# Patient Record
Sex: Female | Born: 1975 | Race: White | Hispanic: No | Marital: Married | Smoking: Never smoker
Health system: Southern US, Academic
[De-identification: ages and names within clinical notes are randomized; demographics above are authoritative.]

## PROBLEM LIST (undated history)

## (undated) DIAGNOSIS — F329 Major depressive disorder, single episode, unspecified: Secondary | ICD-10-CM

## (undated) DIAGNOSIS — E039 Hypothyroidism, unspecified: Secondary | ICD-10-CM

## (undated) DIAGNOSIS — K3184 Gastroparesis: Secondary | ICD-10-CM

## (undated) DIAGNOSIS — F419 Anxiety disorder, unspecified: Secondary | ICD-10-CM

## (undated) DIAGNOSIS — D688 Other specified coagulation defects: Secondary | ICD-10-CM

## (undated) DIAGNOSIS — K219 Gastro-esophageal reflux disease without esophagitis: Secondary | ICD-10-CM

## (undated) DIAGNOSIS — I10 Essential (primary) hypertension: Secondary | ICD-10-CM

## (undated) DIAGNOSIS — F32A Depression, unspecified: Secondary | ICD-10-CM

## (undated) DIAGNOSIS — E119 Type 2 diabetes mellitus without complications: Secondary | ICD-10-CM

## (undated) DIAGNOSIS — M797 Fibromyalgia: Secondary | ICD-10-CM

## (undated) HISTORY — PX: ANKLE SURGERY: SHX546

## (undated) HISTORY — DX: Hypothyroidism, unspecified: E03.9

## (undated) HISTORY — DX: Essential (primary) hypertension: I10

## (undated) HISTORY — DX: Fibromyalgia: M79.7

## (undated) HISTORY — DX: Gastro-esophageal reflux disease without esophagitis: K21.9

## (undated) HISTORY — DX: Other specified coagulation defects: D68.8

## (undated) HISTORY — DX: Anxiety disorder, unspecified: F41.9

## (undated) HISTORY — DX: Gastroparesis: K31.84

## (undated) HISTORY — PX: HX GALL BLADDER SURGERY/CHOLE: SHX55

## (undated) HISTORY — PX: HX CERVICAL POLYPECTOMY: SHX88

## (undated) HISTORY — DX: Depression, unspecified: F32.A

## (undated) HISTORY — DX: Type 2 diabetes mellitus without complications: E11.9

## (undated) NOTE — Progress Notes (Signed)
 Formatting of this note is different from the original.  Subjective   Patient ID: Dawn Michael is a 6 y.o. female presenting to the Urgent Care with a chief complaint of Cough (Scratchy throat,ear pain ,congestion x 6 days ).    HPI  Presents to clinic with complaint of cough, congestion, sore throat x ~ 1 week. Some ear pain, headaches, diarrhea.   No known exposures.  Taking OTC meds, such as Advil cold and flu, with little relief  Denies rashes, dyspnea, vomiting.  Some harsh coughing spells leaving tightness in chest and out of breath, but temporarily.    Objective   Vitals:    05/28/24 0806   BP: (!) 148/87   BP Location: Left arm   Patient Position: Sitting   BP Cuff Size: Adult   Pulse: 87   Temp: 36.1 C (96.9 F)   TempSrc: Temporal   Weight: 113 kg (250 lb)   Height: 1.651 m (5' 5)   BMI (Calculated): 41.6 kg/m2   BSA (Calculated - sq m): 2.28 sq meters   Resp: 20   SpO2: 95%     OB Vitals  OB Status Implant     Social History     Tobacco Use   Smoking Status Never    Passive exposure: Never   Smokeless Tobacco Never       Vital signs reviewed.    Physical Exam  Vitals reviewed.   Constitutional:       General: She is not in acute distress.     Appearance: Normal appearance. She is normal weight. She is ill-appearing. She is not toxic-appearing.   HENT:      Head: Normocephalic and atraumatic.      Right Ear: Tympanic membrane, ear canal and external ear normal.      Left Ear: Tympanic membrane, ear canal and external ear normal.      Nose: Congestion and rhinorrhea present.      Mouth/Throat:      Mouth: Mucous membranes are moist.      Pharynx: Oropharynx is clear. Postnasal drip present. No posterior oropharyngeal erythema.   Eyes:      Conjunctiva/sclera: Conjunctivae normal.      Pupils: Pupils are equal, round, and reactive to light.   Cardiovascular:      Rate and Rhythm: Normal rate and regular rhythm.      Heart sounds: Normal heart sounds.   Pulmonary:      Effort: Pulmonary effort is  normal.      Breath sounds: Normal breath sounds. No wheezing, rhonchi or rales.   Musculoskeletal:      Cervical back: Neck supple.   Lymphadenopathy:      Cervical: No cervical adenopathy.   Skin:     General: Skin is warm.      Findings: No erythema, lesion or rash.   Neurological:      General: No focal deficit present.      Mental Status: She is alert.      Coordination: Coordination normal.      Gait: Gait normal.   Psychiatric:         Mood and Affect: Mood normal.         Behavior: Behavior normal.         Assessment & Plan  Hypertension, unspecified type        History of diabetes mellitus        Acute sinusitis, recurrence not specified, unspecified location  Patient Instructions  Return to clinic or follow up with primary care provider for re-evaluation if symptoms/condition worsening or not improving within the next 7-10 days.  If condition significantly worsening, or if new symptoms such as chest pain or shortness of breath develop, go directly to the nearest emergency room for evaluation.  May take over-the-counter pain/fever reducers as needed. Use as directed.  May take over-the-counter cough/cold medications as needed. Use as directed.  Be sure to drink plenty of fluids and rest during your illness.  Follow up with your primary care provider for blood pressure monitoring, evaluation, and treatment considerations.  Monitor your blood sugar closely. If your glucose rises significantly or if you experience new symptoms, follow up with your managing doctor or go directly to the emergency room for evaluation and management.'    Orders:    azithromycin  (Zithromax ) 250 MG tablet; Take once daily by mouth: 2 tablets on day 1, 1 tablet on days 2-5.    Respiratory infection    Orders:    benzonatate (Tessalon) 100 MG capsule; Take 1 capsule by mouth every 8 hours if needed for cough for up to 10 days. Do not crush or chew.    Bronchospasm    Orders:    albuterol HFA 90 mcg/act inhaler; Inhale 2 puffs every 4  hours if needed for wheezing or shortness of breath (cough).        In-House Lab Results:   No results found for this or any previous visit.     In-House Imaging Reads:        Procedure Documentation:  Procedures         ED Course & MDM   MDM - Medical Decision Making: Home with return precautions    Electronically signed by Donnice JINNY Hoit, PA-C at 05/28/2024  8:26 AM EST

---
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
## 1999-11-18 ENCOUNTER — Ambulatory Visit (HOSPITAL_COMMUNITY): Payer: Self-pay

## 2004-03-16 ENCOUNTER — Ambulatory Visit (INDEPENDENT_AMBULATORY_CARE_PROVIDER_SITE_OTHER): Payer: Self-pay | Admitting: Ophthalmology

## 2015-11-12 DIAGNOSIS — K3184 Gastroparesis: Secondary | ICD-10-CM | POA: Insufficient documentation

## 2015-11-12 DIAGNOSIS — K219 Gastro-esophageal reflux disease without esophagitis: Secondary | ICD-10-CM | POA: Insufficient documentation

## 2015-11-12 DIAGNOSIS — R140 Abdominal distension (gaseous): Secondary | ICD-10-CM | POA: Insufficient documentation

## 2015-11-12 DIAGNOSIS — E282 Polycystic ovarian syndrome: Secondary | ICD-10-CM | POA: Insufficient documentation

## 2015-11-12 DIAGNOSIS — K589 Irritable bowel syndrome without diarrhea: Secondary | ICD-10-CM | POA: Insufficient documentation

## 2015-11-12 DIAGNOSIS — E1143 Type 2 diabetes mellitus with diabetic autonomic (poly)neuropathy: Secondary | ICD-10-CM | POA: Insufficient documentation

## 2015-12-03 DIAGNOSIS — K9041 Non-celiac gluten sensitivity: Secondary | ICD-10-CM | POA: Insufficient documentation

## 2017-02-13 ENCOUNTER — Ambulatory Visit (INDEPENDENT_AMBULATORY_CARE_PROVIDER_SITE_OTHER): Payer: BC Managed Care – PPO | Admitting: Family Medicine

## 2017-02-13 ENCOUNTER — Encounter (INDEPENDENT_AMBULATORY_CARE_PROVIDER_SITE_OTHER): Payer: Self-pay | Admitting: Family Medicine

## 2017-02-13 VITALS — BP 110/80 | HR 102 | Temp 98.0°F | Resp 12 | Ht 65.0 in | Wt 248.0 lb

## 2017-02-13 DIAGNOSIS — F329 Major depressive disorder, single episode, unspecified: Secondary | ICD-10-CM

## 2017-02-13 DIAGNOSIS — E119 Type 2 diabetes mellitus without complications: Secondary | ICD-10-CM

## 2017-02-13 DIAGNOSIS — K2100 Gastro-esophageal reflux disease with esophagitis, without bleeding: Secondary | ICD-10-CM | POA: Insufficient documentation

## 2017-02-13 DIAGNOSIS — Z6841 Body Mass Index (BMI) 40.0 and over, adult: Secondary | ICD-10-CM

## 2017-02-13 DIAGNOSIS — I10 Essential (primary) hypertension: Principal | ICD-10-CM | POA: Insufficient documentation

## 2017-02-13 DIAGNOSIS — E039 Hypothyroidism, unspecified: Secondary | ICD-10-CM

## 2017-02-13 MED ORDER — LOSARTAN 50 MG-HYDROCHLOROTHIAZIDE 12.5 MG TABLET
1.0000 | ORAL_TABLET | Freq: Every day | ORAL | 1 refills | Status: DC
Start: 2017-02-13 — End: 2017-10-28

## 2017-02-13 MED ORDER — METFORMIN 1,000 MG TABLET
ORAL_TABLET | ORAL | 1 refills | Status: DC
Start: 2017-02-13 — End: 2018-06-03

## 2017-02-13 MED ORDER — SYNTHROID 50 MCG TABLET
50.0000 ug | ORAL_TABLET | Freq: Every morning | ORAL | 1 refills | Status: DC
Start: 2017-02-13 — End: 2019-01-20

## 2017-02-13 NOTE — Progress Notes (Signed)
[facility]  [address]  [place] [address]  [facility]  History and Physical     Name: Dawn Michael MRN:  [id number]   Date: 02/13/2017 Age: 41 y.o.       Chief Complaint: Hypertension (Cont on meds to control Losartan 50/12.5 ); Depression (Mainainted on Cymbalta anxiety ); Hypothyroidism (Maintained on Synthroid no recent meds ); and Diabetes Follow up (Cont on Metformin 1000 BID )    History of Present Illness     [name], Michael     has been seen in this clinic within the last three years.     Patient is a 41 y.o. female presenting with hypertension.   Hypertension   This is a chronic problem. The current episode started more than 1 year ago. The problem is unchanged. The problem is controlled. Pertinent negatives include no anxiety, blurred vision, chest pain, headaches, malaise/fatigue, neck pain, orthopnea, palpitations, peripheral edema, PND, shortness of breath or sweats. There are no associated agents to hypertension. There are no known risk factors for coronary artery disease. The current treatment provides no improvement. There are no compliance problems.  There is no history of angina, kidney disease, CAD/MI, CVA, heart failure, PVD or retinopathy. There is no history of chronic renal disease, hyperparathyroidism or renovascular disease.       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)     Fibromyalgia     Gastroparesis     Hypertension     Hypothyroidism          Past Surgical History:   Procedure Laterality Date    ANKLE SURGERY      HX CERVICAL POLYPECTOMY      HX CHOLECYSTECTOMY       Current Outpatient Prescriptions   Medication Sig    Cholecalciferol, Vitamin D3, (VITAMIN D-3) 5,000 unit Oral Tablet Take by mouth    cyanocobalamin (VITAMIN B  12) 1,000 mcg Oral Tablet Take 1,000 mcg by mouth Once a day    DEXILANT 60 mg Oral Cap, Delayed Rel., Multiphasic 60 mg     DULoxetine (CYMBALTA DR) 60 mg Oral Capsule, Delayed Release(E.C.) Take 60 mg by mouth Twice daily     folic acid (FOLVITE) 1 mg Oral Tablet 1 mg Once a day     losartan-hydrochlorothiazide (HYZAAR) 50-12.5 mg Oral Tablet Take 1 Tab by mouth Once a day    MetFORMIN (GLUCOPHAGE) 1,000 mg Oral Tablet take 1 tablet by mouth twice a day    SYNTHROID 50 mcg Oral Tablet 50 mcg      Allergies   Allergen Reactions    Ceclor [Cefaclor]     Trimox [Amoxicillin]     Wellbutrin [Bupropion Hcl]      Family Medical History:     Problem Relation (Age of Onset)    Cancer Father    Diabetes Mother    High Cholesterol Mother    Hypertension Mother    Thyroid Disease Mother            Social History   Substance Use Topics    Smoking status: Never Smoker    Smokeless tobacco: Never Used    Alcohol use Yes  Comment: Occasional       Review of Systems  Review of Systems   Constitutional: Negative for malaise/fatigue.   Eyes: Negative for blurred vision.   Respiratory: Negative for shortness of breath.    Cardiovascular: Negative for chest pain, palpitations, orthopnea and PND.   Musculoskeletal: Negative for neck pain.   Neurological: Negative for headaches.       Examination:  BP 110/80   Pulse (!) 102   Temp 36.7 C (98 F) (Tympanic)    Resp 12   Ht 1.651 m ( )   Wt 112.5 kg (248 lb 0.3 oz)   SpO2 98%   BMI 41.27 kg/m2    Physical Exam   Constitutional: She is oriented to person, place, and time and well-developed, well-nourished, and in no distress.   HENT:   Head: Normocephalic and atraumatic.   Right Ear: External ear normal.   Left Ear: External ear normal.   Nose: Nose normal.   Mouth/Throat: Oropharynx is clear and moist.   Eyes: Pupils are equal, round, and reactive to light. Conjunctivae and EOM are normal. No scleral icterus.   Neck: Normal range of motion. Neck supple.      Cardiovascular: Normal rate, regular rhythm, normal heart sounds and intact distal pulses.    Pulmonary/Chest: Effort normal and breath sounds normal.   Abdominal: Soft. Bowel sounds are normal.   Musculoskeletal: Normal range of motion. She exhibits no edema or tenderness.   Neurological: She is alert and oriented to person, place, and time. She has normal reflexes. No cranial nerve deficit. She exhibits normal muscle tone. Gait normal. Coordination normal. GCS score is 15.   Skin: Skin is warm and dry. No rash noted. No erythema.   Psychiatric: Mood, memory, affect and judgment normal.   Vitals reviewed.      Data reviewed:        Assessment and Plan  Diagnosis  No diagnosis found.    Encounter Medications and Orders  No orders of the defined types were placed in this encounter.      BMI addressed: Advised on diet, weight loss, and exercise to reduce above normal BMI.                Bennie Hind, DO

## 2017-03-02 ENCOUNTER — Ambulatory Visit (INDEPENDENT_AMBULATORY_CARE_PROVIDER_SITE_OTHER): Payer: Self-pay | Admitting: Family Medicine

## 2017-03-02 NOTE — Telephone Encounter (Signed)
Pt was contacted Fax form sent free communicable disease No restriction  sent on 02/28/2017

## 2017-03-02 NOTE — Telephone Encounter (Signed)
-----   Message from [facility] Sierra Friend sent at 03/01/2017  3:40 PM EDT -----  Dawn Michael    Pt said that her pharmacy told her they never received these medications. 90 day refill. Please call to advise.       SYNTHROID 50 mcg Oral Tablet    losartan-hydrochlorothiazide (HYZAAR) 50-12.5 mg Oral Tablet    MetFORMIN (GLUCOPHAGE) 1,000 mg Oral Tablet    Preferred Pharmacy   [facility] [place] - [id number]   Portal to Registered [facility] Sites   [address] [place] [id number]   Phone: [phone] Fax: [phone]   Not a 24 hour pharmacy; exact hours not known

## 2017-03-10 ENCOUNTER — Other Ambulatory Visit: Payer: Self-pay

## 2017-06-06 ENCOUNTER — Encounter (INDEPENDENT_AMBULATORY_CARE_PROVIDER_SITE_OTHER): Payer: Self-pay | Admitting: Family Medicine

## 2017-06-22 ENCOUNTER — Encounter (INDEPENDENT_AMBULATORY_CARE_PROVIDER_SITE_OTHER): Payer: Self-pay | Admitting: Family Medicine

## 2017-06-27 LAB — ENTER/EDIT EXTERNAL COMMON LAB RESULTS
HEMOGLOBIN A1C: 6.7 — AB (ref 0–5.7)
MICROALBUMIN RANDOM URINE: 0.4
MICROALBUMIN RANDOM URINE: 0.4

## 2017-07-11 ENCOUNTER — Other Ambulatory Visit (INDEPENDENT_AMBULATORY_CARE_PROVIDER_SITE_OTHER): Payer: Self-pay | Admitting: Family Medicine

## 2017-07-13 ENCOUNTER — Ambulatory Visit (INDEPENDENT_AMBULATORY_CARE_PROVIDER_SITE_OTHER): Payer: BC Managed Care – PPO | Admitting: Family Medicine

## 2017-07-13 ENCOUNTER — Encounter (INDEPENDENT_AMBULATORY_CARE_PROVIDER_SITE_OTHER): Payer: Self-pay | Admitting: Family Medicine

## 2017-07-13 VITALS — BP 128/80 | HR 90 | Temp 98.2°F | Resp 14 | Ht 68.0 in | Wt 266.3 lb

## 2017-07-13 DIAGNOSIS — I10 Essential (primary) hypertension: Principal | ICD-10-CM

## 2017-07-13 DIAGNOSIS — E119 Type 2 diabetes mellitus without complications: Secondary | ICD-10-CM

## 2017-07-13 DIAGNOSIS — K2100 Gastro-esophageal reflux disease with esophagitis, without bleeding: Secondary | ICD-10-CM

## 2017-07-13 DIAGNOSIS — E039 Hypothyroidism, unspecified: Secondary | ICD-10-CM

## 2017-07-13 NOTE — Progress Notes (Signed)
OUTPATIENT PROGRESS NOTE    Subjective:   Patient ID:  Dawn Michael is a pleasant 42 y.o. female.    Chief Complaint: Follow Up 3 Months (pt here for her check up today c/o weight gain from the antidepression she would like to talk to you about that, [facility] ); Hypertension (Cont on meds to control Losartan 50/12.5); Depression; Hypothyroidism (Maintained on Synthroid ); and Diabetes Follow up (Cont on Metformin 1000 BID )      History of Present Illness:  HTN: HAs No    Dizziness No   Feeling like BP is too high or low No   Compliant with taking meds Yes   Home BP:  can't remember  DM: Fasting FS range 150     Nonfasting FS range 130   Hypoglycemia episodes  No   Compliant with diabetic diet Yes   Sores on feet No  Diabetes Monitors  A1C: Not Found  A1C Date: Not Found          Urine Microalbumin: Not Found   Microalbumin Date: Not Found      Retinal Exam Date: 07/02/2017  Last diabetic foot exam: Not Found    Hypothyroidism: Hair changes No     Skin changes No     Diarrhea or constipation  No     Heat or cold intolerance  No     Palpitations No     Compliant with taking Synthroid Yes                The history is provided by the patient.      Allergies:     Allergies   Allergen Reactions   . Ceclor [Cefaclor]    . Trimox [Amoxicillin]    . Wellbutrin [Bupropion Hcl]          Medications:     Outpatient Medications Prior to Visit:  Cholecalciferol, Vitamin D3, (VITAMIN D-3) 5,000 unit Oral Tablet Take by mouth   cyanocobalamin (VITAMIN B 12) 1,000 mcg Oral Tablet Take 1,000 mcg by mouth Once a day   DEXILANT 60 mg Oral Cap, Delayed Rel., Multiphasic 60 mg    DULoxetine (CYMBALTA DR) 60 mg Oral Capsule, Delayed Release(E.C.) Take 60 mg by mouth Twice daily    folic acid (FOLVITE) 1 mg Oral Tablet 1 mg Once a day    losartan-hydrochlorothiazide (HYZAAR) 50-12.5 mg Oral Tablet Take 1 Tab by mouth Once a day for 90 days   MetFORMIN (GLUCOPHAGE) 1,000 mg Oral Tablet take 1 tablet by mouth twice a day     SYNTHROID 50 mcg Oral Tablet Take 1 Tab (50 mcg total) by mouth Every morning for 90 days   VRAYLAR 1.5 mg Oral Capsule take 1 capsule by mouth daily     No facility-administered medications prior to visit.       Immunization History:     There is no immunization history on file for this patient.      Past Medical History:     Past Medical History:   Diagnosis Date   . Anxiety    . Depression    . Diabetes mellitus, type 2 (CMS HCC)    . Esophageal reflux    . 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 Fibromyalgia    . Gastroparesis    . Hypertension    . Hypothyroidism              Past Surgical History:  Past Surgical History:   Procedure Laterality Date   . ANKLE SURGERY     . HX CERVICAL POLYPECTOMY     . HX CHOLECYSTECTOMY               Family History:     Family Medical History:     Problem Relation (Age of Onset)    Cancer Father    Diabetes Mother    High Cholesterol Mother    Hypertension Mother    Thyroid Disease Mother                Social History:   Dawn Michael  reports that she has never smoked. She has never used smokeless tobacco. She reports that she drinks alcohol.          Review of Systems: Other than ROS in HPI, all other systems are negative.      Objective:   Vitals:    Vitals:    07/13/17 1338   BP: 128/80   Pulse: 90   Resp: 14   Temp: 36.8 C (98.2 F)   TempSrc: Thermal Scan   SpO2: 98%   Weight: 120.8 kg (266 lb 5.1 oz)   Height: 1.727 m (5\' 8" )   BMI: 40.58          Body mass index is 40.49 kg/m.      Constitutional: Alert, well developed, well nourished  HEENT:  Head: NC/AT    Eyes: Sclera anicteric, conjunctiva not injected    Ears: EAC normal, bilateral TMs clear    Nose: No discharge    Throat: MMM, posterior pharynx without erythema or exudate  Neck:   Supple with normal ROM, no cervical LAD, no thyromegaly, no JVD, no carotid bruits  Cardiovascular: RRR, normal S1/S2, no murmurs/rubs/gallops  Pulmonary:  CTAB, equal air entry, nonlabored, no  wheezes/crackles/rhonchi  Abdomen:   NABS, NT/ND, soft, no HSM, no masses  Musculoskeletal:  No deformity, no injury, no edema  Neurological:   Alert, oriented x 3, no abnormal tone  Skin:     Warm, pink, dry, no rashes, no jaundice, no pallor, no cyanosis  Psychiatric:  Normal mood, affect, behavior, judgment, and thought content        Assessment & Plan:     1. Essential hypertension  stable    2. Acquired hypothyroidism  stable    3. Type 2 diabetes mellitus without complication, without long-term current use of insulin (CMS HCC)  awaiting endo eval    4. Gastroesophageal reflux disease with esophagitis  stable              Health Maintenance   Topic Date Due   . HIV Screening  09/26/1989   . Adult Tdap-Td (1 - Tdap) 09/26/1993   . Pneumococcal 19-64 Years Medium Risk (1 of 1 - PPSV23) 09/26/1993   . Pap smear  09/27/1995   . Influenza Vaccine (1) 01/06/2017   . Depression Screening  02/13/2018       No follow-ups on file.    Bennie Hind, DO

## 2017-08-17 ENCOUNTER — Other Ambulatory Visit (INDEPENDENT_AMBULATORY_CARE_PROVIDER_SITE_OTHER): Payer: Self-pay | Admitting: Family Medicine

## 2017-10-12 LAB — HGA1C (HEMOGLOBIN A1C WITH EST AVG GLUCOSE)
HEMOGLOBIN A1C: 7
HEMOGLOBIN A1C: 7

## 2017-10-15 ENCOUNTER — Encounter (INDEPENDENT_AMBULATORY_CARE_PROVIDER_SITE_OTHER): Payer: Self-pay | Admitting: Family Medicine

## 2017-10-17 ENCOUNTER — Other Ambulatory Visit (INDEPENDENT_AMBULATORY_CARE_PROVIDER_SITE_OTHER): Payer: Self-pay | Admitting: Family Medicine

## 2017-10-18 ENCOUNTER — Ambulatory Visit (INDEPENDENT_AMBULATORY_CARE_PROVIDER_SITE_OTHER): Payer: Self-pay | Admitting: Family Medicine

## 2017-10-18 NOTE — Telephone Encounter (Signed)
faxed  Dawn Salkaylor Vinton Layson, MA  10/18/2017, 08:53

## 2017-10-18 NOTE — Telephone Encounter (Signed)
Regarding: office needs info faxed for pt's first visit  ----- Message from Leland JohnsPamela Long sent at 10/18/2017  8:26 AM EDT -----  Bennie Hindiffany Pluto, DO    Pt is new to them, and called to request last 2 office visit notes, and any thyroid ultrasounds or scans. Please fax to listed number.  Thanks

## 2017-10-23 ENCOUNTER — Ambulatory Visit (INDEPENDENT_AMBULATORY_CARE_PROVIDER_SITE_OTHER): Payer: BC Managed Care – PPO | Admitting: Family Medicine

## 2017-10-23 ENCOUNTER — Encounter (INDEPENDENT_AMBULATORY_CARE_PROVIDER_SITE_OTHER): Payer: Self-pay | Admitting: Family Medicine

## 2017-10-23 VITALS — BP 130/80 | HR 114 | Temp 98.0°F | Resp 14 | Ht 68.0 in | Wt 258.4 lb

## 2017-10-23 DIAGNOSIS — K2100 Gastro-esophageal reflux disease with esophagitis, without bleeding: Secondary | ICD-10-CM

## 2017-10-23 DIAGNOSIS — E119 Type 2 diabetes mellitus without complications: Secondary | ICD-10-CM

## 2017-10-23 DIAGNOSIS — E039 Hypothyroidism, unspecified: Secondary | ICD-10-CM

## 2017-10-23 DIAGNOSIS — I10 Essential (primary) hypertension: Secondary | ICD-10-CM

## 2017-10-23 NOTE — Progress Notes (Signed)
OUTPATIENT PROGRESS NOTE    Subjective:   Patient ID:  [name] is a pleasant 42 y.o. female.    Chief Complaint: Hypertension (Cont on meds to control losartan 50/12.5 po q d); Diabetes Follow up; Multiple medical problems follow up; and Medication Check      History of Present Illness:  HTN: HAs No    Dizziness No   Feeling like BP is too high or low No   Compliant with taking meds Yes   Home BP:  did not bring log  Hypothyroidism: Hair changes No     Skin changes No     Diarrhea or constipation  No     Heat or cold intolerance  No     Palpitations No     Compliant with taking Synthroid Yes    DM: Fasting FS range    Nonfasting FS range    Hypoglycemia episodes  No   Compliant with diabetic diet No   Sores on feet No  Diabetes Monitors  A1C: 7  A1C Date: 10/12/2017          Urine Microalbumin: 0.4   Microalbumin Date: 06/27/2017      Retinal Exam Date: 07/02/2017  Last diabetic foot exam: Not Found          The history is provided by the patient.      Allergies:     Allergies   Allergen Reactions   . Ceclor [Cefaclor]    . Trimox [Amoxicillin]    . Wellbutrin [Bupropion Hcl]          Medications:     Outpatient Medications Prior to Visit:  Cholecalciferol, Vitamin D3, (VITAMIN D-3) 5,000 unit Oral Tablet Take by mouth   cyanocobalamin (VITAMIN B 12) 1,000 mcg Oral Tablet Take 1,000 mcg by mouth Once a day   DEXILANT 60 mg Oral Cap, Delayed Rel., Multiphasic 60 mg    DULoxetine (CYMBALTA DR) 60 mg Oral Capsule, Delayed Release(E.C.) Take 60 mg by mouth Twice daily    folic acid (FOLVITE) 1 mg Oral Tablet 1 mg Once a day    losartan-hydrochlorothiazide (HYZAAR) 50-12.5 mg Oral Tablet Take 1 Tab by mouth Once a day for 90 days   MetFORMIN (GLUCOPHAGE) 1,000 mg Oral Tablet take 1 tablet by mouth twice a day   SYNTHROID 50 mcg Oral Tablet Take 1 Tab (50 mcg total) by mouth Every morning for 90 days   VRAYLAR 1.5 mg Oral Capsule take 1 capsule by mouth daily     No facility-administered medications prior to visit.            Immunization History:     There is no immunization history on file for this patient.      Past Medical History:     Past Medical History:   Diagnosis Date   . Anxiety    . Depression    . Diabetes mellitus, type 2 (CMS HCC)    . Esophageal reflux    . 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 Fibromyalgia    . Gastroparesis    . Hypertension    . Hypothyroidism              Past Surgical History:     Past Surgical History:   Procedure Laterality Date   . ANKLE SURGERY     . HX CERVICAL POLYPECTOMY     . HX CHOLECYSTECTOMY  Family History:     Family Medical History:     Problem Relation (Age of Onset)    Cancer Father    Diabetes Mother    High Cholesterol Mother    Hypertension (High Blood Pressure) Mother    Thyroid Disease Mother                Social History:   Dawn Michael  reports that she has never smoked. She has never used smokeless tobacco. She reports that she drinks alcohol.          Review of Systems: Other than ROS in HPI, all other systems are negative.      Objective:   Vitals:    Vitals:    10/23/17 1538   BP: 130/80   Pulse: (!) 114   Resp: 14   Temp: 36.7 C (98 F)   TempSrc: Thermal Scan   SpO2: 98%   Weight: 117.2 kg (258 lb 6.1 oz)   Height: 1.727 m (5\' 8" )   BMI: 39.37          Body mass index is 39.29 kg/m.      Constitutional: Alert, well developed, well nourished  HEENT:  Head: NC/AT    Eyes: Sclera anicteric, conjunctiva not injected    Ears: EAC normal, bilateral TMs clear    Nose: No discharge    Throat: MMM, posterior pharynx without erythema or exudate  Neck:   Supple with normal ROM, no cervical LAD, no thyromegaly, no JVD, no carotid bruits  Cardiovascular: RRR, normal S1/S2, no murmurs/rubs/gallops  Pulmonary:  CTAB, equal air entry, nonlabored, no wheezes/crackles/rhonchi  Abdomen:   NABS, NT/ND, soft, no HSM, no masses  Musculoskeletal:  No deformity, no injury, no edema  Neurological:   Alert, oriented x 3, no abnormal tone  Skin:     Warm, pink,  dry, no rashes, no jaundice, no pallor, no cyanosis  Psychiatric:  Normal mood, affect, behavior, judgment, and thought content        Assessment & Plan:     1. Essential hypertension  Well controlled with current regimen which we will continue.      2. Acquired hypothyroidism  Stable cont same dose      3. Type 2 diabetes mellitus without complication, without long-term current use of insulin (CMS HCC)  Continue current meds, including ACE-I and statin.  Urine microalbumin UTD.  Annual eye exam UTD.  Podiatry not indicated.  Dietary compliance stressed.  Recommend checking FS QD - BID.      4. Gastroesophageal reflux disease with esophagitis  asymptomatic4            Health Maintenance   Topic Date Due   . HIV Screening  09/26/1989   . Adult Tdap-Td (1 - Tdap) 09/26/1993   . Pneumococcal 19-64 Years Medium Risk (1 of 1 - PPSV23) 09/26/1993   . Pap smear  09/27/1995   . Influenza Vaccine (Season Ended) 01/06/2018   . Depression Screening  02/13/2018       No follow-ups on file.    Bennie Hind, DO

## 2017-10-28 ENCOUNTER — Other Ambulatory Visit (INDEPENDENT_AMBULATORY_CARE_PROVIDER_SITE_OTHER): Payer: Self-pay | Admitting: Family Medicine

## 2018-02-20 ENCOUNTER — Encounter (INDEPENDENT_AMBULATORY_CARE_PROVIDER_SITE_OTHER): Payer: Self-pay | Admitting: Family Medicine

## 2018-03-14 ENCOUNTER — Encounter (INDEPENDENT_AMBULATORY_CARE_PROVIDER_SITE_OTHER): Payer: Self-pay | Admitting: Family Medicine

## 2018-03-14 ENCOUNTER — Ambulatory Visit (INDEPENDENT_AMBULATORY_CARE_PROVIDER_SITE_OTHER): Payer: BC Managed Care – PPO | Admitting: Family Medicine

## 2018-03-14 VITALS — BP 120/80 | HR 113 | Temp 98.0°F | Wt 261.7 lb

## 2018-03-14 DIAGNOSIS — I10 Essential (primary) hypertension: Secondary | ICD-10-CM

## 2018-03-14 DIAGNOSIS — Z6839 Body mass index (BMI) 39.0-39.9, adult: Secondary | ICD-10-CM

## 2018-03-14 DIAGNOSIS — E119 Type 2 diabetes mellitus without complications: Secondary | ICD-10-CM

## 2018-03-14 DIAGNOSIS — E039 Hypothyroidism, unspecified: Secondary | ICD-10-CM

## 2018-03-14 MED ORDER — SEMAGLUTIDE 0.25 MG OR 0.5 MG (2 MG/1.5 ML) SUBCUTANEOUS PEN INJECTOR
0.2500 mg | PEN_INJECTOR | SUBCUTANEOUS | 2 refills | Status: DC
Start: 2018-03-14 — End: 2018-05-21

## 2018-03-14 NOTE — Progress Notes (Signed)
OUTPATIENT PROGRESS NOTE    Subjective:   Patient ID:  Dawn Michael is a pleasant 42 y.o. female.    Chief Complaint: Hypertension (Here for f/u visit HTN cont on meds to control Losartan 50/12.5 po q d); Hypothyroidism (Maintained on meds to control synthroid 50 po q d); and Diabetes Follow up (Cont on meds to control 1000 BID 7 down to 6.6 )      History of Present Illness:  HTN: HAs No    Dizziness No   Feeling like BP is too high or low No   Compliant with taking meds Yes   Home BP:  did not bring log  Hypothyroidism: Hair changes No     Skin changes No     Diarrhea or constipation  No     Heat or cold intolerance  No     Palpitations No     Compliant with taking Synthroid Yes    DM: Fasting FS range    Nonfasting FS range    Hypoglycemia episodes  No   Compliant with diabetic diet No   Sores on feet No  Diabetes Monitors  A1C: 7  A1C Date: 10/12/2017          Urine Microalbumin: 0.4   Microalbumin Date: 06/27/2017      Retinal Exam Date: 07/02/2017  Last diabetic foot exam: Not Found  Fibromyalgia much worse pt states in bed most of time. Applied for disability but told she made too much        The history is provided by the patient.      Allergies:     Allergies   Allergen Reactions   . Ceclor [Cefaclor]    . Trimox [Amoxicillin]    . Wellbutrin [Bupropion Hcl]          Medications:     Outpatient Medications Prior to Visit:  Cholecalciferol, Vitamin D3, (VITAMIN D-3) 5,000 unit Oral Tablet Take by mouth   cyanocobalamin (VITAMIN B 12) 1,000 mcg Oral Tablet Take 1,000 mcg by mouth Once a day   DEXILANT 60 mg Oral Cap, Delayed Rel., Multiphasic 60 mg    DULoxetine (CYMBALTA DR) 60 mg Oral Capsule, Delayed Release(E.C.) Take 60 mg by mouth Twice daily    folic acid (FOLVITE) 1 mg Oral Tablet 1 mg Once a day    glimepiride (AMARYL) 2 mg Oral Tablet Take 2 mg by mouth Every morning with breakfast   losartan-hydrochlorothiazide (HYZAAR) 50-12.5 mg Oral Tablet TAKE 1 TABLET DAILY   MetFORMIN (GLUCOPHAGE) 1,000 mg Oral  Tablet take 1 tablet by mouth twice a day   SYNTHROID 50 mcg Oral Tablet Take 1 Tab (50 mcg total) by mouth Every morning for 90 days   VRAYLAR 1.5 mg Oral Capsule take 1 capsule by mouth daily     No facility-administered medications prior to visit.       Immunization History:     There is no immunization history on file for this patient.      Past Medical History:     Past Medical History:   Diagnosis Date   . Anxiety    . Depression    . Diabetes mellitus, type 2 (CMS HCC)    . Esophageal reflux    . 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 Fibromyalgia    . Gastroparesis    . Hypertension    . Hypothyroidism              Past  Surgical History:     Past Surgical History:   Procedure Laterality Date   . ANKLE SURGERY     . HX CERVICAL POLYPECTOMY     . HX CHOLECYSTECTOMY               Family History:     Family Medical History:     Problem Relation (Age of Onset)    Cancer Father    Diabetes Mother    High Cholesterol Mother    Hypertension (High Blood Pressure) Mother    Thyroid Disease Mother                Social History:   Dawn Michael  reports that she has never smoked. She has never used smokeless tobacco. She reports that she drinks alcohol.          Review of Systems: Other than ROS in HPI, all other systems are negative.      Objective:   Vitals:    Vitals:    03/14/18 1136   BP: 120/80   Pulse: (!) 113   Temp: 36.7 C (98 F)   TempSrc: Thermal Scan   SpO2: 99%   Weight: 118.7 kg (261 lb 11 oz)          Body mass index is 39.79 kg/m.      Constitutional: Alert, well developed, well nourished  HEENT:  Head: NC/AT    Eyes: Sclera anicteric, conjunctiva not injected    Ears: EAC normal, bilateral TMs clear    Nose: No discharge    Throat: MMM, posterior pharynx without erythema or exudate  Neck:   Supple with normal ROM, no cervical LAD, no thyromegaly, no JVD, no carotid bruits  Cardiovascular: RRR, normal S1/S2, no murmurs/rubs/gallops  Pulmonary:  CTAB, equal air entry, nonlabored, no  wheezes/crackles/rhonchi  Abdomen:   NABS, NT/ND, soft, no HSM, no masses  Musculoskeletal:  No deformity, no injury, no edema  Neurological:   Alert, oriented x 3, no abnormal tone  Skin:     Warm, pink, dry, no rashes, no jaundice, no pallor, no cyanosis  Psychiatric:  Normal mood, affect, behavior, judgment, and thought content    Diabetic foot exam:  Both feet without edema or ulcerations. Pulses normal bilaterally. Sensation normal bilaterally          Assessment & Plan:     1. Essential hypertension  Well controlled with current regimen which we will continue.      - Hemoglobin A1C; Future  - Comp Metabolic Panel- Fasting; Future  - Lipid Panel; Future  - TSH Sensitive; Future  - Urine Microalbumin Random; Future    2. Acquired hypothyroidism  stable  - Hemoglobin A1C; Future  - Comp Metabolic Panel- Fasting; Future  - Lipid Panel; Future  - TSH Sensitive; Future  - Urine Microalbumin Random; Future    3. Type 2 diabetes mellitus without complication, without long-term current use of insulin (CMS HCC)  Try ozempic  - Hemoglobin A1C; Future  - Comp Metabolic Panel- Fasting; Future  - Lipid Panel; Future  - TSH Sensitive; Future  - Urine Microalbumin Random; Future              Health Maintenance   Topic Date Due   . HIV Screening  09/26/1989   . Adult Tdap-Td (1 - Tdap) 09/26/1993   . Pneumococcal 19-64 Years Medium Risk (1 of 1 - PPSV23) 09/26/1993   . Pap smear  09/27/1995   . Influenza Vaccine (1) 01/06/2018   .  Depression Screening  02/13/2018       No follow-ups on file.    The patient has been educated and verbalized understanding regarding the services provided during this visit.      Bennie Hind, DO

## 2018-05-21 ENCOUNTER — Other Ambulatory Visit (INDEPENDENT_AMBULATORY_CARE_PROVIDER_SITE_OTHER): Payer: Self-pay | Admitting: Family Medicine

## 2018-05-21 MED ORDER — SEMAGLUTIDE 0.25 MG OR 0.5 MG (2 MG/1.5 ML) SUBCUTANEOUS PEN INJECTOR
0.25 mg | PEN_INJECTOR | SUBCUTANEOUS | 1 refills | Status: DC
Start: 2018-05-21 — End: 2019-04-11

## 2018-05-21 NOTE — Telephone Encounter (Signed)
Regarding: RX Issue  ----- Message from Lamount Cohen sent at 05/21/2018  9:30 AM EST -----  Bennie Hind, DO    Patient states that the following needs to be a 90-day-prescription for her insurance. And, it needs sent to [facility].    semaglutide (OZEMPIC) 0.25 mg or 0.5 mg(2 mg/1.5 mL) Subcutaneous Pen Injector 4 Syringe 2 03/14/2018 04/13/2018   Sig - Route: 0.25 mg by Subcutaneous route Every 7 days for 30 days - Subcutaneous   Sent to pharmacy as: semaglutide 0.25 mg or 0.5 mg (2 mg/1.5 mL) subcutaneous pen injector (OZEMPIC)   Class: E-Rx     Preferred Pharmacy     [facility] [place] [place] - [address]   Portal to Registered [facility] Sites    [address] [place] [address]    Phone: [phone] Fax: [phone]    Not a 24 hour pharmacy; exact hours not known.

## 2018-05-25 LAB — HGA1C (HEMOGLOBIN A1C WITH EST AVG GLUCOSE): HEMOGLOBIN A1C: 6.6

## 2018-05-27 ENCOUNTER — Other Ambulatory Visit (INDEPENDENT_AMBULATORY_CARE_PROVIDER_SITE_OTHER): Payer: Self-pay | Admitting: Family Medicine

## 2018-06-03 ENCOUNTER — Other Ambulatory Visit (INDEPENDENT_AMBULATORY_CARE_PROVIDER_SITE_OTHER): Payer: Self-pay | Admitting: Family Medicine

## 2018-06-03 NOTE — Telephone Encounter (Signed)
Regarding: RX Refill  ----- Message from [facility] sent at 06/03/2018  4:44 PM EST -----  Tiffany Pluto, DO    LOV: 11.7.19  NOV: 2.7.20        MetFORMIN (GLUCOPHAGE) 1,000 mg Oral Tablet 180 Tab 1 02/13/2017    Sig: take 1 tablet by mouth twice a day   Sent to pharmacy as: MetFORMIN (GLUCOPHAGE) 1,000 mg Oral Tablet   Class: E-Rx     Preferred Pharmacy     [facility] [place], [place] - [address]   Portal to Registered [facility] Sites    [address] [place] [address]    Phone: [phone] Fax: [phone]

## 2018-06-04 MED ORDER — METFORMIN 1,000 MG TABLET: Tab | ORAL | 1 refills | 0 days | Status: AC

## 2018-06-07 ENCOUNTER — Other Ambulatory Visit (INDEPENDENT_AMBULATORY_CARE_PROVIDER_SITE_OTHER): Payer: Self-pay | Admitting: Family Medicine

## 2018-06-14 ENCOUNTER — Ambulatory Visit (INDEPENDENT_AMBULATORY_CARE_PROVIDER_SITE_OTHER): Payer: BC Managed Care – PPO | Admitting: Family Medicine

## 2018-06-14 DIAGNOSIS — Z029 Encounter for administrative examinations, unspecified: Secondary | ICD-10-CM

## 2018-06-17 NOTE — Progress Notes (Signed)
The patient did not appear for their appointment/or scheduled appointment was cancelled.  This office visit opened in error.

## 2018-06-18 ENCOUNTER — Encounter (INDEPENDENT_AMBULATORY_CARE_PROVIDER_SITE_OTHER): Payer: Self-pay | Admitting: Family Medicine

## 2018-06-19 ENCOUNTER — Ambulatory Visit (INDEPENDENT_AMBULATORY_CARE_PROVIDER_SITE_OTHER): Payer: BC Managed Care – PPO | Admitting: Family Medicine

## 2018-06-19 DIAGNOSIS — Z029 Encounter for administrative examinations, unspecified: Secondary | ICD-10-CM

## 2018-06-20 NOTE — Progress Notes (Signed)
The patient did not appear for their appointment/or scheduled appointment was cancelled.  This office visit opened in error.    The patient did not appear for their appointment/or scheduled appointment was cancelled.  This office visit opened in error.    The patient did not appear for their appointment/or scheduled appointment was cancelled.  This office visit opened in error.

## 2018-06-25 ENCOUNTER — Encounter (INDEPENDENT_AMBULATORY_CARE_PROVIDER_SITE_OTHER): Payer: Self-pay | Admitting: Family Medicine

## 2018-06-25 ENCOUNTER — Other Ambulatory Visit: Payer: Self-pay

## 2018-06-25 ENCOUNTER — Ambulatory Visit (INDEPENDENT_AMBULATORY_CARE_PROVIDER_SITE_OTHER): Payer: BC Managed Care – PPO | Admitting: Family Medicine

## 2018-06-25 VITALS — BP 100/80 | HR 134 | Temp 98.0°F | Resp 14 | Ht 68.0 in | Wt 240.3 lb

## 2018-06-25 DIAGNOSIS — I10 Essential (primary) hypertension: Secondary | ICD-10-CM

## 2018-06-25 DIAGNOSIS — E039 Hypothyroidism, unspecified: Secondary | ICD-10-CM

## 2018-06-25 DIAGNOSIS — E119 Type 2 diabetes mellitus without complications: Secondary | ICD-10-CM

## 2018-06-25 NOTE — Progress Notes (Signed)
OUTPATIENT PROGRESS NOTE    Subjective:   Patient ID:  Dawn Michael is a pleasant 43 y.o. female.    Chief Complaint: Hypertension (Here for f/u visit cont losartan-hydrochlorothiazide 50/12.5 po q d ); Hypothyroidism (Cont on synthroid po q d TSH 2.70); and Diabetes Follow up (Maintained on metformin 1000 BID ozempic A1C 6.6)      History of Present Illness:  HTN: HAs No    Dizziness No   Feeling like BP is too high or low No   Compliant with taking meds Yes   Home BP:  did not bring log  DM: Fasting FS range 130   Nonfasting FS range 140   Hypoglycemia episodes  No   Compliant with diabetic diet Yes   Sores on feet No  Diabetes Monitors  A1C: 6.6  A1C Date: 05/25/2018           Nephropathy Screening: On ACEI or ARB      Retinal Exam Date: 07/02/2017  Last diabetic foot exam: 03/14/2018      Hypothyroidism: Hair changes No     Skin changes No     Diarrhea or constipation  No     Heat or cold intolerance  No     Palpitations No     Compliant with taking Synthroid Yes        The history is provided by the patient.      Allergies:     Allergies   Allergen Reactions   . Ceclor [Cefaclor]    . Trimox [Amoxicillin]    . Wellbutrin [Bupropion Hcl]          Medications:   ACCU-CHEK FASTCLIX LANCET DRUM Does not apply Misc,   ACCU-CHEK GUIDE Does not apply Strip,   Blood Sugar Diagnostic Strip, Accu-Chek Guide test strips, test twice daily  Cholecalciferol, Vitamin D3, (VITAMIN D-3) 5,000 unit Oral Tablet, Take by mouth  cyanocobalamin (VITAMIN B 12) 1,000 mcg Oral Tablet, Take 1,000 mcg by mouth Once a day  DEXILANT 60 mg Oral Cap, Delayed Rel., Multiphasic, Take 60 mg by mouth Twice daily   DULoxetine (CYMBALTA DR) 60 mg Oral Capsule, Delayed Release(E.C.), Take 60 mg by mouth Twice daily   folic acid (FOLVITE) 1 mg Oral Tablet, 1 mg Once a day   Lancets Misc, Accu-chek fast clix lancets, test twice daily  losartan-hydrochlorothiazide (HYZAAR) 50-12.5 mg Oral Tablet, TAKE 1 TABLET DAILY  MetFORMIN (GLUCOPHAGE) 1,000 mg Oral  Tablet, take 1 tablet by mouth twice a day  semaglutide (OZEMPIC) 0.25 mg or 0.5 mg(2 mg/1.5 mL) Subcutaneous Pen Injector, 0.25 mg by Subcutaneous route Every 7 days for 30 days  SYNTHROID 50 mcg Oral Tablet, Take 1 Tab (50 mcg total) by mouth Every morning for 90 days  VRAYLAR 1.5 mg Oral Capsule, take 1 capsule by mouth daily  glimepiride (AMARYL) 2 mg Oral Tablet, Take 2 mg by mouth Every morning with breakfast    No facility-administered medications prior to visit.         Immunization History:     There is no immunization history on file for this patient.      Past Medical History:     Past Medical History:   Diagnosis Date   . Anxiety    . Depression    . Diabetes mellitus, type 2 (CMS HCC)    . Esophageal reflux    . 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 Fibromyalgia    . Gastroparesis    .  Hypertension    . Hypothyroidism              Past Surgical History:     Past Surgical History:   Procedure Laterality Date   . ANKLE SURGERY     . HX CERVICAL POLYPECTOMY     . HX CHOLECYSTECTOMY               Family History:     Family Medical History:     Problem Relation (Age of Onset)    Cancer Father    Diabetes Mother    High Cholesterol Mother    Hypertension (High Blood Pressure) Mother    Thyroid Disease Mother                Social History:   Zoye Kerkman  reports that she has never smoked. She has never used smokeless tobacco. She reports current alcohol use. She reports being sexually active and has had partner(s) who are Female.          Review of Systems: Other than ROS in HPI, all other systems are negative.      Objective:   Vitals:    Vitals:    06/25/18 1355   BP: 100/80   Pulse: (!) 134   Resp: 14   Temp: 36.7 C (98 F)   TempSrc: Thermal Scan   SpO2: 98%   Weight: 109 kg (240 lb 4.8 oz)   Height: 1.727 m (5\' 8" )   BMI: 36.61          Body mass index is 36.54 kg/m.      Constitutional: Alert, well developed, well nourished  HEENT:  Head: NC/AT    Eyes: Sclera anicteric, conjunctiva not  injected    Ears: EAC normal, bilateral TMs clear    Nose: No discharge    Throat: MMM, posterior pharynx without erythema or exudate  Neck:   Supple with normal ROM, no cervical LAD, no thyromegaly, no JVD, no carotid bruits  Cardiovascular: RRR, normal S1/S2, no murmurs/rubs/gallops  Pulmonary:  CTAB, equal air entry, nonlabored, no wheezes/crackles/rhonchi  Abdomen:   NABS, NT/ND, soft, no HSM, no masses  Musculoskeletal:  No deformity, no injury, no edema  Neurological:   Alert, oriented x 3, no abnormal tone  Skin:     Warm, pink, dry, no rashes, no jaundice, no pallor, no cyanosis  Psychiatric:  Normal mood, affect, behavior, judgment, and thought content        Assessment & Plan:     1. Essential hypertension  Well controlled with current regimen which we will continue.        2. Type 2 diabetes mellitus without complication, without long-term current use of insulin (CMS HCC)  Continue current meds, including ARB .  Urine microalbumin UTD.  Annual eye exam UTD.  Podiatry not indicated.  Dietary compliance stressed.  Recommend checking FS QD - BID.      3. Acquired hypothyroidism  Stable on meds              Health Maintenance   Topic Date Due   . HIV Screening  09/26/1989   . Adult Tdap-Td (1 - Tdap) 09/26/1993   . Pneumococcal 19-64 Years Medium Risk (1 of 1 - PPSV23) 09/26/1993   . Pap smear  09/27/1995   . Influenza Vaccine (1) 01/06/2018   . Depression Screening  03/15/2019       No follow-ups on file.    The patient has been educated and verbalized  understanding regarding the services provided during this visit.      Philmore Pali, DO

## 2018-08-30 ENCOUNTER — Ambulatory Visit (INDEPENDENT_AMBULATORY_CARE_PROVIDER_SITE_OTHER): Payer: Self-pay | Admitting: Family Medicine

## 2018-08-30 NOTE — Telephone Encounter (Signed)
medlist faxed.  Dawn Michael, [place]  08/30/2018, 16:07

## 2018-08-30 NOTE — Telephone Encounter (Signed)
Regarding: fax med list  ----- Message from Lelon Frohlich Egidi sent at 08/30/2018  4:02 PM EDT -----  Bennie Hind, DO    [facility] is requesting you to fax pt's current medication list to: [phone]

## 2018-09-09 ENCOUNTER — Telehealth (INDEPENDENT_AMBULATORY_CARE_PROVIDER_SITE_OTHER): Payer: Self-pay | Admitting: Family Medicine

## 2018-09-09 NOTE — Telephone Encounter (Signed)
Called patient prior to appointment to prescreen for COVID-19.      Have you had new or worsened shortness of breath in the past 14 days?  No  Have you had a new or worsening cough in the past 14 days?  No  Have you had a fever in the past 14 days?  No  Have you experienced a loss of taste or smell in the past 14 days? No  Have you or someone you have been in close contact with, been test for COVID-19 or tested positive for COVID-19? No    Patient or patients guardian/attendant has a Negative Prescreen.  Instructed patient to proceed with appointment as planned.    Patient informed of visitor policy at this time.    Informed patient that we are requesting all patients wear a patient supplied mask when entering the clinic.     Appointment notes have been updated to reflect screening.    Patient instructed to present to the clinic for scheduled appointment    Dawn Michael  09/09/2018, 11:49

## 2018-09-10 ENCOUNTER — Other Ambulatory Visit: Payer: Self-pay

## 2018-09-10 ENCOUNTER — Encounter (INDEPENDENT_AMBULATORY_CARE_PROVIDER_SITE_OTHER): Payer: Self-pay | Admitting: Family Medicine

## 2018-09-10 ENCOUNTER — Ambulatory Visit (INDEPENDENT_AMBULATORY_CARE_PROVIDER_SITE_OTHER): Payer: BC Managed Care – PPO | Admitting: Family Medicine

## 2018-09-10 VITALS — BP 110/80 | HR 105 | Temp 98.7°F | Resp 14 | Ht 68.0 in | Wt 246.3 lb

## 2018-09-10 DIAGNOSIS — K219 Gastro-esophageal reflux disease without esophagitis: Secondary | ICD-10-CM

## 2018-09-10 DIAGNOSIS — E282 Polycystic ovarian syndrome: Secondary | ICD-10-CM

## 2018-09-10 DIAGNOSIS — I10 Essential (primary) hypertension: Principal | ICD-10-CM

## 2018-09-10 DIAGNOSIS — E119 Type 2 diabetes mellitus without complications: Secondary | ICD-10-CM

## 2018-09-10 NOTE — Progress Notes (Signed)
OUTPATIENT PROGRESS NOTE    Subjective:   Patient ID:  Dawn Michael is a pleasant 43 y.o. female.    Chief Complaint: Hypertension (Here for f/u visit cont on meds to control losartan 50/12.5 po q d); Hypothyroidism (Cont on meds to control synthroid 50 po q d); and Fibromyalgia (Maintained on meds to control )      History of Present Illness:  HTN: HAs No    Dizziness No   Feeling like BP is too high or low No   Compliant with taking meds Yes   Home BP:  did not bring log  Hypothyroidism: Hair changes No     Skin changes No     Diarrhea or constipation  No     Heat or cold intolerance  No     Palpitations No     Compliant with taking Synthroid Yes    Fibromyalgia doing well  Skin rash x 1 year  The history is provided by the patient.      Allergies:     Allergies   Allergen Reactions   . Ceclor [Cefaclor]    . Trimox [Amoxicillin]    . Wellbutrin [Bupropion Hcl]          Medications:   ACCU-CHEK FASTCLIX LANCET DRUM Does not apply Misc,   ACCU-CHEK GUIDE Does not apply Strip,   Blood Sugar Diagnostic Strip, Accu-Chek Guide test strips, test twice daily  Cholecalciferol, Vitamin D3, (VITAMIN D-3) 5,000 unit Oral Tablet, Take by mouth  cyanocobalamin (VITAMIN B 12) 1,000 mcg Oral Tablet, Take 1,000 mcg by mouth Once a day  DEXILANT 60 mg Oral Cap, Delayed Rel., Multiphasic, Take 60 mg by mouth Twice daily   DULoxetine (CYMBALTA DR) 60 mg Oral Capsule, Delayed Release(E.C.), Take 60 mg by mouth Twice daily   folic acid (FOLVITE) 1 mg Oral Tablet, 1 mg Once a day   Lancets Misc, Accu-chek fast clix lancets, test twice daily  lansoprazole (PREVACID) 30 mg Oral Capsule, Delayed Release(E.C.), Take by mouth  losartan-hydrochlorothiazide (HYZAAR) 50-12.5 mg Oral Tablet, TAKE 1 TABLET DAILY  MetFORMIN (GLUCOPHAGE) 1,000 mg Oral Tablet, take 1 tablet by mouth twice a day  metoclopramide HCl (REGLAN) 10 mg Oral Tablet, Take by mouth  semaglutide (OZEMPIC) 0.25 mg or 0.5 mg(2 mg/1.5 mL) Subcutaneous Pen Injector, 0.25 mg by  Subcutaneous route Every 7 days for 30 days  SYNTHROID 50 mcg Oral Tablet, Take 1 Tab (50 mcg total) by mouth Every morning for 90 days  vitamin B complex (VITAMIN B COMPLEX) Oral Capsule, Take 1 Cap by mouth  VRAYLAR 1.5 mg Oral Capsule, take 1 capsule by mouth daily    No facility-administered medications prior to visit.         Immunization History:     There is no immunization history on file for this patient.      Past Medical History:     Past Medical History:   Diagnosis Date   . Anxiety    . Depression    . Diabetes mellitus, type 2 (CMS HCC)    . Esophageal reflux    . 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 Fibromyalgia    . Gastroparesis    . Hypertension    . Hypothyroidism              Past Surgical History:     Past Surgical History:   Procedure Laterality Date   . ANKLE SURGERY     . HX  CERVICAL POLYPECTOMY     . HX CHOLECYSTECTOMY               Family History:     Family Medical History:     Problem Relation (Age of Onset)    Cancer Father    Diabetes Mother    High Cholesterol Mother    Hypertension (High Blood Pressure) Mother    Thyroid Disease Mother                Social History:   Dawn Michael  reports that she has never smoked. She has never used smokeless tobacco. She reports current alcohol use. She reports being sexually active and has had partner(s) who are Female.          Review of Systems: Other than ROS in HPI, all other systems are negative.      Objective:   Vitals:    Vitals:    09/10/18 1122   BP: 110/80   Pulse: (!) 105   Resp: 14   Temp: 37.1 C (98.7 F)   TempSrc: Thermal Scan   SpO2: 98%   Weight: 112 kg (246 lb 4.8 oz)   Height: 1.727 m (5\' 8" )   BMI: 37.53          Body mass index is 37.45 kg/m.      Constitutional: Alert, well developed, well nourished  HEENT:  Head: NC/AT    Eyes: Sclera anicteric, conjunctiva not injected    Ears: EAC normal, bilateral TMs clear    Nose: No discharge    Throat: MMM, posterior pharynx without erythema or  exudate  Neck:   Supple with normal ROM, no cervical LAD, no thyromegaly, no JVD, no carotid bruits  Cardiovascular: RRR, normal S1/S2, no murmurs/rubs/gallops  Pulmonary:  CTAB, equal air entry, nonlabored, no wheezes/crackles/rhonchi  Abdomen:   NABS, NT/ND, soft, no HSM, no masses  Musculoskeletal:  No deformity, no injury, no edema  Neurological:   Alert, oriented x 3, no abnormal tone  Skin:     Warm, pink, dry, no rashes, no jaundice, no pallor, no cyanosis  Psychiatric:  Normal mood, affect, behavior, judgment, and thought content        Assessment & Plan:   1. Essential hypertension  Well controlled with current regimen which we will continue.      - COMPREHENSIVE METABOLIC PNL, FASTING; Future  - LIPID PANEL; Future  - THYROID STIMULATING HORMONE (SENSITIVE TSH); Future  - MICROALBUMIN URINE, RANDOM; Future  - Hemoglobin A1C; Future    2. Gastro-esophageal reflux disease without esophagitis  stable  - COMPREHENSIVE METABOLIC PNL, FASTING; Future  - LIPID PANEL; Future  - THYROID STIMULATING HORMONE (SENSITIVE TSH); Future  - MICROALBUMIN URINE, RANDOM; Future  - Hemoglobin A1C; Future    3. Type 2 diabetes mellitus without complication, without long-term current use of insulin (CMS HCC)  Well controlled  - COMPREHENSIVE METABOLIC PNL, FASTING; Future  - LIPID PANEL; Future  - THYROID STIMULATING HORMONE (SENSITIVE TSH); Future  - MICROALBUMIN URINE, RANDOM; Future  - Hemoglobin A1C; Future    4. Polycystic ovarian syndrome  stable  - COMPREHENSIVE METABOLIC PNL, FASTING; Future  - LIPID PANEL; Future  - THYROID STIMULATING HORMONE (SENSITIVE TSH); Future  - MICROALBUMIN URINE, RANDOM; Future  - Hemoglobin A1C; Future                Health Maintenance   Topic Date Due   . HIV Screening  09/26/1989   . Adult Tdap-Td (  1 - Tdap) 09/26/1993   . Pneumococcal 19-64 Years Medium Risk (1 of 1 - PPSV23) 09/26/1993   . Pap smear  09/27/1995   . Influenza Vaccine (Season Ended) 01/07/2019   . Depression Screening   03/15/2019       No follow-ups on file.    The patient has been educated and verbalized understanding regarding the services provided during this visit.      Bennie Hindiffany Felisha Claytor, DO

## 2018-09-24 ENCOUNTER — Encounter (INDEPENDENT_AMBULATORY_CARE_PROVIDER_SITE_OTHER): Payer: Self-pay | Admitting: Family Medicine

## 2018-10-09 ENCOUNTER — Ambulatory Visit (INDEPENDENT_AMBULATORY_CARE_PROVIDER_SITE_OTHER): Payer: Self-pay | Admitting: Family Medicine

## 2018-10-09 NOTE — Telephone Encounter (Signed)
Regarding: RX Request   ----- Message from [facility] sent at 10/09/2018  4:27 PM EDT -----  Bennie Hind, DO    LOV: 5.5.20  NOV: 8.12.20    Patient states she has symptoms of UTI.  synptoms- frequency, light blood when wiping one time        [facility] - [place], [place] - [address]    [address] [place] [address]    Phone: [phone] Fax: [phone]    Not a 24 hour pharmacy; exact hours not known.

## 2018-10-10 ENCOUNTER — Telehealth (INDEPENDENT_AMBULATORY_CARE_PROVIDER_SITE_OTHER): Payer: Self-pay | Admitting: Family Medicine

## 2018-10-10 NOTE — Telephone Encounter (Signed)
Called patient prior to appointment to prescreen for COVID-19.      Have you had new or worsened shortness of breath in the past 14 days?  No  Have you had a new or worsening cough in the past 14 days?  No  Have you had a fever in the past 14 days?  No  Have you experienced a loss of taste or smell in the past 14 days? No  Have you or someone you have been in close contact with, been test for COVID-19 or tested positive for COVID-19? No    Patient or patients guardian/attendant has a Negative Prescreen.  Instructed patient to proceed with appointment as planned.    Patient informed of visitor policy at this time.    Informed patient that we are requesting all patients wear a patient supplied mask when entering the clinic.     Appointment notes have been updated to reflect screening.    Patient instructed to present to the clinic for scheduled appointment    Dawn Michael  10/10/2018, 09:04

## 2018-10-10 NOTE — Telephone Encounter (Signed)
pt needs appt please contact pt

## 2018-10-11 ENCOUNTER — Encounter (INDEPENDENT_AMBULATORY_CARE_PROVIDER_SITE_OTHER): Payer: Self-pay | Admitting: Family Medicine

## 2018-11-11 ENCOUNTER — Other Ambulatory Visit (INDEPENDENT_AMBULATORY_CARE_PROVIDER_SITE_OTHER): Payer: Self-pay | Admitting: Family Medicine

## 2018-11-12 NOTE — Telephone Encounter (Signed)
Last scheduled appointment with you was 09/10/2018.  Currently scheduled future appointment is 12/18/2018.    Confirmed preferred pharmacy for this refill encounter is   Preferred Pharmacy     [facility] [place] [place] [facility] [address] [address]    Phone: [phone] Fax: [phone]    Not a 24 hour pharmacy; exact hours not known.    [facility] [address], [place] - [place].    [place] [address]    Phone: [phone] Fax: [phone]    Not a 24 hour pharmacy; exact hours not known.      Gilberto Better, MA  11/12/2018, 11:40

## 2018-12-12 LAB — LIPID PANEL
CHOLESTEROL: 170
HDL-CHOLESTEROL: 35
LDL (CALCULATED): 110
NON - HDL (CALCULATED): 135
TRIGLYCERIDES: 139

## 2018-12-12 LAB — HGA1C (HEMOGLOBIN A1C WITH EST AVG GLUCOSE): HEMOGLOBIN A1C: 6.1

## 2018-12-17 ENCOUNTER — Telehealth (INDEPENDENT_AMBULATORY_CARE_PROVIDER_SITE_OTHER): Payer: Self-pay | Admitting: Family Medicine

## 2018-12-17 NOTE — Telephone Encounter (Signed)
Called patient prior to appointment to prescreen for COVID-19.      Have you had new or worsened shortness of breath in the past 14 days?  No  Have you had a new or worsening cough in the past 14 days?  No  Have you had a fever in the past 14 days?  No  Have you experienced a loss of taste or smell in the past 14 days? No  Have you experienced headache with nausea in the past 14 days? No    Patient or patients guardian/attendant has a Negative Prescreen.  Instructed patient to proceed with appointment as planned.    Patient informed of visitor policy at this time.    Informed patient that we are requesting all patients and visitors wear a mask when entering the clinic.     Appointment notes have been updated to reflect screening.    Patient instructed to present to the clinic for scheduled appointment    Dawn Michael  12/17/2018, 13:14

## 2018-12-18 ENCOUNTER — Encounter (INDEPENDENT_AMBULATORY_CARE_PROVIDER_SITE_OTHER): Payer: Self-pay | Admitting: Family Medicine

## 2018-12-24 ENCOUNTER — Encounter (INDEPENDENT_AMBULATORY_CARE_PROVIDER_SITE_OTHER): Payer: Self-pay | Admitting: Family Medicine

## 2018-12-26 ENCOUNTER — Telehealth (INDEPENDENT_AMBULATORY_CARE_PROVIDER_SITE_OTHER): Payer: Self-pay | Admitting: Family Medicine

## 2018-12-26 NOTE — Telephone Encounter (Signed)
Called patient prior to appointment to prescreen for COVID-19.      Have you had new or worsened shortness of breath in the past 14 days?  No  Have you had a new or worsening cough in the past 14 days?  No  Have you had a fever in the past 14 days?  No  Have you experienced a loss of taste or smell in the past 14 days? No  Have you experienced headache with nausea in the past 14 days? No    Patient or patients guardian/attendant has a Negative Prescreen.      Patient informed of visitor policy at this time.    Informed patient that we are requesting all patients and visitors wear a mask when entering the clinic.     Appointment notes have been updated to reflect screening.    Patient instructed to present to the clinic for scheduled appointment.    Kerin Perna  12/26/2018, 09:46

## 2018-12-27 ENCOUNTER — Other Ambulatory Visit: Payer: Self-pay

## 2018-12-27 ENCOUNTER — Encounter (INDEPENDENT_AMBULATORY_CARE_PROVIDER_SITE_OTHER): Payer: Self-pay | Admitting: Family Medicine

## 2018-12-27 ENCOUNTER — Ambulatory Visit (INDEPENDENT_AMBULATORY_CARE_PROVIDER_SITE_OTHER): Payer: BC Managed Care – PPO | Admitting: Family Medicine

## 2018-12-27 VITALS — BP 120/80 | HR 95 | Temp 97.0°F | Ht 68.0 in | Wt 246.6 lb

## 2018-12-27 DIAGNOSIS — E039 Hypothyroidism, unspecified: Secondary | ICD-10-CM

## 2018-12-27 DIAGNOSIS — I10 Essential (primary) hypertension: Secondary | ICD-10-CM

## 2018-12-27 DIAGNOSIS — F32A Depression, unspecified: Secondary | ICD-10-CM

## 2018-12-27 DIAGNOSIS — F329 Major depressive disorder, single episode, unspecified: Secondary | ICD-10-CM

## 2018-12-27 DIAGNOSIS — E119 Type 2 diabetes mellitus without complications: Secondary | ICD-10-CM

## 2018-12-27 NOTE — Progress Notes (Signed)
OUTPATIENT PROGRESS NOTE    Subjective:   Patient ID:  Dawn Michael is a pleasant 43 y.o. female.    Chief Complaint: Hypertension (Here for f/u visit cont on meds to control losartan 50/12.5 po q d ); Hypothyroidism (Cont on synthroid 50 po q d ); and Diabetes Follow up (Cont on Metformin 1000 BID Ozempic q 2 weeks )      History of Present Illness:  DM: Fasting FS range 153   Nonfasting FS range    Hypoglycemia episodes  No   Compliant with diabetic diet Yes   Sores on feet No  Diabetes Monitors  A1C: 6.1  A1C Date: 12/12/2018           Nephropathy Screening: On ACEI or ARB    Last Lipid Panel  (Last result in the past 2 years)      Cholesterol   HDL   LDL   Direct LDL   Triglycerides      12/12/18 170 35 110   139        Retinal Exam Date: 07/02/2017  Last diabetic foot exam: 03/14/2018      HTN: HAs No    Dizziness No   Feeling like BP is too high or low No   Compliant with taking meds Yes   Home BP:  did not bring log  Hypothyroidism: Hair changes No     Skin changes No     Diarrhea or constipation  No     Heat or cold intolerance  No     Palpitations No     Compliant with taking Synthroid No    Mood: Symptoms controlled   Yes   Compliant with taking medication Yes    Need for dosage or medication change No         The history is provided by the patient.      Allergies:     Allergies   Allergen Reactions   . Ceclor [Cefaclor]    . Trimox [Amoxicillin]    . Wellbutrin [Bupropion Hcl]          Medications:   ACCU-CHEK FASTCLIX LANCET DRUM Does not apply Misc,   ACCU-CHEK GUIDE Does not apply Strip,   Blood Sugar Diagnostic Strip, Accu-Chek Guide test strips, test twice daily  Cholecalciferol, Vitamin D3, (VITAMIN D-3) 5,000 unit Oral Tablet, Take by mouth  cyanocobalamin (VITAMIN B 12) 1,000 mcg Oral Tablet, Take 1,000 mcg by mouth Once a day  DEXILANT 60 mg Oral Cap, Delayed Rel., Multiphasic, Take 60 mg by mouth Twice daily   DULoxetine (CYMBALTA DR) 60 mg Oral Capsule, Delayed Release(E.C.), Take 60 mg by mouth  Twice daily   folic acid (FOLVITE) 1 mg Oral Tablet, 1 mg Once a day   Lancets Misc, Accu-chek fast clix lancets, test twice daily  losartan-hydrochlorothiazide (HYZAAR) 50-12.5 mg Oral Tablet, TAKE 1 TABLET DAILY  MetFORMIN (GLUCOPHAGE) 1,000 mg Oral Tablet, TAKE 1 TABLET TWICE A DAY  metoclopramide HCl (REGLAN) 10 mg Oral Tablet, Take by mouth  semaglutide (OZEMPIC) 0.25 mg or 0.5 mg(2 mg/1.5 mL) Subcutaneous Pen Injector, 0.25 mg by Subcutaneous route Every 7 days for 30 days  SYNTHROID 50 mcg Oral Tablet, Take 1 Tab (50 mcg total) by mouth Every morning for 90 days  vitamin B complex (VITAMIN B COMPLEX) Oral Capsule, Take 1 Cap by mouth  VRAYLAR 1.5 mg Oral Capsule, take 1 capsule by mouth daily  lansoprazole (PREVACID) 30 mg Oral Capsule, Delayed Release(E.C.), Take by mouth  No facility-administered medications prior to visit.         Immunization History:     There is no immunization history on file for this patient.      Past Medical History:     Past Medical History:   Diagnosis Date   . Anxiety    . Depression    . Diabetes mellitus, type 2 (CMS HCC)    . Esophageal reflux    . 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 Fibromyalgia    . Gastroparesis    . Hypertension    . Hypothyroidism              Past Surgical History:     Past Surgical History:   Procedure Laterality Date   . ANKLE SURGERY     . HX CERVICAL POLYPECTOMY     . HX CHOLECYSTECTOMY               Family History:     Family Medical History:     Problem Relation (Age of Onset)    Cancer Father    Diabetes Mother    High Cholesterol Mother    Hypertension (High Blood Pressure) Mother    Thyroid Disease Mother                Social History:   Talani Brazee  reports that she has never smoked. She has never used smokeless tobacco. She reports current alcohol use. She reports being sexually active and has had partner(s) who are Female.          Review of Systems: Other than ROS in HPI, all other systems are negative.      Objective:      Vitals:    Vitals:    12/27/18 0735   BP: 120/80   Pulse: 95   Temp: 36.1 C (97 F)   TempSrc: Thermal Scan   SpO2: 98%   Weight: 112 kg (246 lb 9.6 oz)   Height: 1.727 m (5\' 8" )   BMI: 37.57          Body mass index is 37.5 kg/m.      Constitutional: Alert, well developed, well nourished  HEENT:  Head: NC/AT    Eyes: Sclera anicteric, conjunctiva not injected    Ears: EAC normal, bilateral TMs clear    Nose: No discharge    Throat: MMM, posterior pharynx without erythema or exudate  Neck:   Supple with normal ROM, no cervical LAD, no thyromegaly, no JVD, no carotid bruits  Cardiovascular: RRR, normal S1/S2, no murmurs/rubs/gallops  Pulmonary:  CTAB, equal air entry, nonlabored, no wheezes/crackles/rhonchi  Abdomen:   NABS, NT/ND, soft, no HSM, no masses  Musculoskeletal:  No deformity, no injury, no edema  Neurological:   Alert, oriented x 3, no abnormal tone  Skin:     Warm, pink, dry, no rashes, no jaundice, no pallor, no cyanosis  Psychiatric:  Normal mood, affect, behavior, judgment, and thought content    Diabetic foot exam:  Sensation normal bilaterally.           Assessment & Plan:     1. Type 2 diabetes mellitus without complication, without long-term current use of insulin (CMS HCC)  Continue current meds, including ACE-I and statin.  Urine microalbumin UTD.  Annual eye exam UTD.  Podiatry not indicated.  Dietary compliance stressed.  Recommend checking FS QD - BID.    - COMPREHENSIVE METABOLIC PNL, FASTING; Future  - THYROID  STIMULATING HORMONE (SENSITIVE TSH); Future    2. Essential hypertension  Well controlled with current regimen which we will continue.      - COMPREHENSIVE METABOLIC PNL, FASTING; Future  - THYROID STIMULATING HORMONE (SENSITIVE TSH); Future  - Hemoglobin A1C; Future    3. Acquired hypothyroidism  stable  - Hemoglobin A1C; Future    4. Depression, unspecified depression type  Dr Rollen SoxBrinkley              Health Maintenance   Topic Date Due   . HIV Screening  09/26/1989   . Adult  Tdap-Td (1 - Tdap) 09/26/1993   . Pneumococcal 19-64 Years Medium Risk (1 of 1 - PPSV23) 09/26/1993   . Pap smear  09/27/1995   . Influenza Vaccine (1) 01/07/2019   . Depression Screening  03/15/2019       No follow-ups on file.    The patient has been educated and verbalized understanding regarding the services provided during this visit.      Bennie Hindiffany Suzette Flagler, DO

## 2019-01-20 ENCOUNTER — Other Ambulatory Visit (INDEPENDENT_AMBULATORY_CARE_PROVIDER_SITE_OTHER): Payer: Self-pay | Admitting: Family Medicine

## 2019-01-20 MED ORDER — SYNTHROID 50 MCG TABLET
50.00 ug | ORAL_TABLET | Freq: Every morning | ORAL | 3 refills | Status: DC
Start: 2019-01-20 — End: 2019-10-03

## 2019-01-20 NOTE — Telephone Encounter (Signed)
Last scheduled appointment with you was 12/27/2018.  Currently scheduled future appointment is 03/31/2019.    Patient has been seen within the last year: Yes.    Confirmed preferred pharmacy for this refill encounter is   Preferred Pharmacy     [facility] [place] [facility] [address] [address]    Phone: [phone] Fax: [phone]    Not a 24 hour pharmacy; exact hours not known.                      Marland Kitchen     Dawn Michael  01/20/2019, 09:10

## 2019-03-28 ENCOUNTER — Telehealth (INDEPENDENT_AMBULATORY_CARE_PROVIDER_SITE_OTHER): Payer: Self-pay | Admitting: Family Medicine

## 2019-03-28 NOTE — Telephone Encounter (Signed)
Department of Community Practice     Attempted to contact patient to complete COVID-19 prescreen prior to scheduled appointment. Unable to reach patient at this time, left message for patient to call back in and complete prescreening prior to appointment. Call back number provided [facility] [phone].    Ranelle Oyster  03/28/2019, 13:56

## 2019-03-28 NOTE — Telephone Encounter (Signed)
Department of Community Practice     Attempted to contact patient to complete COVID-19 prescreen prior to scheduled appointment. Unable to reach patient at this time, left message for patient to call back in and complete prescreening prior to appointment. Call back number provided [facility] [phone].    Ranelle Oyster  03/28/2019, 13:54

## 2019-03-31 ENCOUNTER — Other Ambulatory Visit: Payer: Self-pay

## 2019-03-31 ENCOUNTER — Ambulatory Visit (INDEPENDENT_AMBULATORY_CARE_PROVIDER_SITE_OTHER): Payer: BC Managed Care – PPO | Admitting: Family Medicine

## 2019-03-31 ENCOUNTER — Encounter (INDEPENDENT_AMBULATORY_CARE_PROVIDER_SITE_OTHER): Payer: Self-pay | Admitting: Family Medicine

## 2019-03-31 VITALS — BP 120/70 | HR 85 | Temp 96.5°F | Resp 14 | Wt 249.0 lb

## 2019-03-31 DIAGNOSIS — I10 Essential (primary) hypertension: Secondary | ICD-10-CM

## 2019-03-31 DIAGNOSIS — F329 Major depressive disorder, single episode, unspecified: Secondary | ICD-10-CM

## 2019-03-31 DIAGNOSIS — E039 Hypothyroidism, unspecified: Secondary | ICD-10-CM

## 2019-03-31 DIAGNOSIS — E119 Type 2 diabetes mellitus without complications: Secondary | ICD-10-CM

## 2019-03-31 DIAGNOSIS — K219 Gastro-esophageal reflux disease without esophagitis: Secondary | ICD-10-CM

## 2019-03-31 DIAGNOSIS — E282 Polycystic ovarian syndrome: Secondary | ICD-10-CM

## 2019-03-31 DIAGNOSIS — F32A Depression, unspecified: Secondary | ICD-10-CM

## 2019-03-31 NOTE — Progress Notes (Signed)
OUTPATIENT PROGRESS NOTE    Subjective:   Patient ID:  Dawn Michael is a pleasant 43 y.o. female.    Chief Complaint: No chief complaint on file.      History of Present Illness:  DM: Fasting FS range    Nonfasting FS range 140   Hypoglycemia episodes  No   Compliant with diabetic diet Yes   Sores on feet No  Diabetes Monitors  A1C: 6.1  A1C Date: 12/12/2018           Nephropathy Screening: On ACEI or ARB    Last Lipid Panel  (Last result in the past 2 years)      Cholesterol   HDL   LDL   Direct LDL   Triglycerides      12/12/18 170 35 110   139        Retinal Exam Date: 07/02/2017  Last diabetic foot exam: 12/27/2018      HTN: HAs No    Dizziness No   Feeling like BP is too high or low No   Compliant with taking meds No   Home BP:  did not bring log  Mood: Symptoms controlled   Yes   Compliant with taking medication Yes    Need for dosage or medication change No      The history is provided by the patient.      Allergies:     Allergies   Allergen Reactions   . Ceclor [Cefaclor]    . Trimox [Amoxicillin]    . Wellbutrin [Bupropion Hcl]          Medications:     .  ACCU-CHEK FASTCLIX LANCET DRUM Does not apply Misc,     .  ACCU-CHEK GUIDE Does not apply Strip,     .  Blood Sugar Diagnostic Strip, Accu-Chek Guide test strips, test twice daily    .  Cholecalciferol, Vitamin D3, (VITAMIN D-3) 5,000 unit Oral Tablet, Take by mouth    .  cyanocobalamin (VITAMIN B 12) 1,000 mcg Oral Tablet, Take 1,000 mcg by mouth Once a day    .  DEXILANT 60 mg Oral Cap, Delayed Rel., Multiphasic, Take 60 mg by mouth Twice daily     .  DULoxetine (CYMBALTA DR) 60 mg Oral Capsule, Delayed Release(E.C.), Take 60 mg by mouth Twice daily     .  folic acid (FOLVITE) 1 mg Oral Tablet, 1 mg Once a day     .  Lancets Misc, Accu-chek fast clix lancets, test twice daily    .  losartan-hydrochlorothiazide (HYZAAR) 50-12.5 mg Oral Tablet, TAKE 1 TABLET DAILY    .  MetFORMIN (GLUCOPHAGE) 1,000 mg Oral Tablet, TAKE 1 TABLET TWICE A DAY    .   metoclopramide HCl (REGLAN) 10 mg Oral Tablet, Take by mouth    .  semaglutide (OZEMPIC) 0.25 mg or 0.5 mg(2 mg/1.5 mL) Subcutaneous Pen Injector, 0.25 mg by Subcutaneous route Every 7 days for 30 days    .  SYNTHROID 50 mcg Oral Tablet, Take 1 Tab (50 mcg total) by mouth Every morning for 360 days    .  vitamin B complex (VITAMIN B COMPLEX) Oral Capsule, Take 1 Cap by mouth    .  VRAYLAR 1.5 mg Oral Capsule, take 1 capsule by mouth daily    No facility-administered medications prior to visit.         Immunization History:     There is no immunization history on file for this patient.  Past Medical History:     Past Medical History:   Diagnosis Date   . Anxiety    . Depression    . Diabetes mellitus, type 2 (CMS HCC)    . Esophageal reflux    . 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 Fibromyalgia    . Gastroparesis    . Hypertension    . Hypothyroidism              Past Surgical History:     Past Surgical History:   Procedure Laterality Date   . ANKLE SURGERY     . HX CERVICAL POLYPECTOMY     . HX CHOLECYSTECTOMY               Family History:     Family Medical History:     Problem Relation (Age of Onset)    Cancer Father    Diabetes Mother    High Cholesterol Mother    Hypertension (High Blood Pressure) Mother    Thyroid Disease Mother                Social History:   Dawn Michael  reports that she has never smoked. She has never used smokeless tobacco. She reports current alcohol use. She reports being sexually active and has had partner(s) who are Female.          Review of Systems: Other than ROS in HPI, all other systems are negative.      Objective:   Vitals:  There were no vitals filed for this visit.       There is no height or weight on file to calculate BMI.      Constitutional: Alert, well developed, well nourished  HEENT:  Head: NC/AT    Eyes: Sclera anicteric, conjunctiva not injected    Ears: EAC normal, bilateral TMs clear    Nose: No discharge    Throat: MMM, posterior pharynx  without erythema or exudate  Neck:   Supple with normal ROM, no cervical LAD, no thyromegaly, no JVD, no carotid bruits  Cardiovascular: RRR, normal S1/S2, no murmurs/rubs/gallops  Pulmonary:  CTAB, equal air entry, nonlabored, no wheezes/crackles/rhonchi  Abdomen:   NABS, NT/ND, soft, no HSM, no masses  Musculoskeletal:  No deformity, no injury, no edema  Neurological:   Alert, oriented x 3, no abnormal tone  Skin:     Warm, pink, dry, no rashes, no jaundice, no pallor, no cyanosis  Psychiatric:  Normal mood, affect, behavior, judgment, and thought content        Assessment & Plan:     1. Type 2 diabetes mellitus without complication, without long-term current use of insulin (CMS HCC)  Continue current meds, including ACE-I and statin.  Urine microalbumin UTD.  Annual eye exam UTD.  Podiatry not indicated.  Dietary compliance stressed.  Recommend checking FS QD - BID.      2. Essential hypertension  Well controlled with current regimen which we will continue.      3. Acquired hypothyroidism  Stable on meds    4. Depression, unspecified depression type  stable    5. Polycystic ovarian syndrome  Stable    6. Gastro-esophageal reflux disease without esophagitis  Cont rx              Health Maintenance   Topic Date Due   . HIV Screening  09/26/1989   . Adult Tdap-Td (1 - Tdap) 09/26/1993   . Pneumococcal 19-64 Years Medium  Risk (1 of 1 - PPSV23) 09/26/1993   . Pap smear  09/27/1995   . Influenza Vaccine (1) 01/07/2019   . Hemoglobin A1C for Diabetes Control  12/12/2019       No follow-ups on file.    The patient has been educated and verbalized understanding regarding the services provided during this visit.      Philmore Pali, DO

## 2019-04-04 ENCOUNTER — Other Ambulatory Visit (INDEPENDENT_AMBULATORY_CARE_PROVIDER_SITE_OTHER): Payer: Self-pay | Admitting: Family Medicine

## 2019-04-10 ENCOUNTER — Other Ambulatory Visit (INDEPENDENT_AMBULATORY_CARE_PROVIDER_SITE_OTHER): Payer: Self-pay | Admitting: Family Medicine

## 2019-09-13 LAB — MICROALBUMIN URINE, RANDOM: MICROALBUMIN RANDOM URINE: 259

## 2019-09-13 LAB — LIPID PANEL
CHOLESTEROL: 182
HDL-CHOLESTEROL: 45
LDL (CALCULATED): 109
NON - HDL (CALCULATED): 137
TRIGLYCERIDES: 165

## 2019-09-13 LAB — HGA1C (HEMOGLOBIN A1C WITH EST AVG GLUCOSE): HEMOGLOBIN A1C: 2.9

## 2019-09-19 ENCOUNTER — Other Ambulatory Visit (INDEPENDENT_AMBULATORY_CARE_PROVIDER_SITE_OTHER): Payer: Self-pay | Admitting: Family Medicine

## 2019-09-29 ENCOUNTER — Ambulatory Visit (INDEPENDENT_AMBULATORY_CARE_PROVIDER_SITE_OTHER): Payer: BC Managed Care – PPO | Admitting: Family Medicine

## 2019-09-29 ENCOUNTER — Other Ambulatory Visit: Payer: Self-pay

## 2019-09-29 VITALS — BP 110/72 | HR 72 | Temp 97.0°F | Resp 12 | Ht 68.0 in | Wt 250.0 lb

## 2019-09-29 DIAGNOSIS — R197 Diarrhea, unspecified: Secondary | ICD-10-CM

## 2019-09-29 DIAGNOSIS — E039 Hypothyroidism, unspecified: Secondary | ICD-10-CM

## 2019-09-29 DIAGNOSIS — I10 Essential (primary) hypertension: Secondary | ICD-10-CM

## 2019-09-29 DIAGNOSIS — E119 Type 2 diabetes mellitus without complications: Secondary | ICD-10-CM

## 2019-09-29 MED ORDER — ACCU-CHEK FASTCLIX LANCET DRUM
1.00 [IU] | Freq: Two times a day (BID) | 5 refills | Status: AC
Start: 2019-09-29 — End: 2019-12-28

## 2019-09-29 MED ORDER — BLOOD SUGAR DIAGNOSTIC STRIPS
ORAL_STRIP | 1 refills | Status: DC
Start: 2019-09-29 — End: 2022-04-18

## 2019-09-29 MED ORDER — DAPAGLIFLOZIN PROPANEDIOL 5 MG TABLET
5.00 mg | ORAL_TABLET | Freq: Every day | ORAL | 0 refills | Status: AC
Start: 2019-09-29 — End: 2019-12-28

## 2019-09-29 NOTE — Progress Notes (Signed)
OUTPATIENT PROGRESS NOTE    Subjective:   Patient ID:  Dawn Michael is a pleasant 44 y.o. female.    Chief Complaint: Diabetes Follow up (Cont on meds to control ozempic  metformin 1000 BID ), Hypertension (Maintained on meds to control losartan 50/12.5 po q d), Hypothyroidism (Cont on synthroid 50 po q d ), and Difficulty Swallowing (Cont on dexilant po q d)      History of Present Illness:  DM: Fasting FS range 247   Nonfasting FS range    Hypoglycemia episodes  No   Compliant with diabetic diet No   Sores on feet No  Diabetes Monitors  A1C: 2.9  A1C Date: 09/13/2019           Nephropathy Screening: On ACEI or ARB    Last Lipid Panel  (Last result in the past 2 years)      Cholesterol   HDL   LDL   Direct LDL   Triglycerides      09/13/19 0000 182 45 109   165        Retinal Exam Date: 07/02/2017  Last diabetic foot exam: 12/27/2018      HTN: HAs No    Dizziness No   Feeling like BP is too high or low No   Compliant with taking meds Yes   Home BP:  did not bring log  Hypothyroidism: Hair changes No     Skin changes No     Diarrhea or constipation  No     Heat or cold intolerance  No     Palpitations No     Compliant with taking Synthroid Yes  Diarrhea associated with metformin      The history is provided by the patient.      Allergies:     Allergies   Allergen Reactions   . Ceclor [Cefaclor]    . Trimox [Amoxicillin]    . Wellbutrin [Bupropion Hcl]          Medications:   ACCU-CHEK GUIDE Does not apply Strip,   Cholecalciferol, Vitamin D3, (VITAMIN D-3) 5,000 unit Oral Tablet, Take by mouth  cyanocobalamin (VITAMIN B 12) 1,000 mcg Oral Tablet, Take 1,000 mcg by mouth Once a day  DEXILANT 60 mg Oral Cap, Delayed Rel., Multiphasic, Take 60 mg by mouth Twice daily   DULoxetine (CYMBALTA DR) 60 mg Oral Capsule, Delayed Release(E.C.), Take 60 mg by mouth Twice daily   folic acid (FOLVITE) 1 mg Oral Tablet, 1 mg Once a day   Lancets Misc, Accu-chek fast clix lancets, test twice daily  losartan-hydrochlorothiazide  (HYZAAR) 50-12.5 mg Oral Tablet, TAKE 1 TABLET DAILY  MetFORMIN (GLUCOPHAGE) 1,000 mg Oral Tablet, TAKE 1 TABLET TWICE A DAY  metoclopramide HCl (REGLAN) 10 mg Oral Tablet, Take by mouth  OZEMPIC 0.25 mg or 0.5 mg(2 mg/1.5 mL) Subcutaneous Pen Injector, INJECT 0.25MG               SUBCUTANEOUSLY EVERY 7 DAYS  SYNTHROID 50 mcg Oral Tablet, Take 1 Tab (50 mcg total) by mouth Every morning for 360 days  vitamin B complex (VITAMIN B COMPLEX) Oral Capsule, Take 1 Cap by mouth  VRAYLAR 1.5 mg Oral Capsule, take 1 capsule by mouth daily  ACCU-CHEK FASTCLIX LANCET DRUM Does not apply Misc,   Blood Sugar Diagnostic Strip, Accu-Chek Guide test strips, test twice daily    No facility-administered medications prior to visit.        Immunization History:     There is no  immunization history on file for this patient.      Past Medical History:     Past Medical History:   Diagnosis Date   . Anxiety    . Depression    . Diabetes mellitus, type 2 (CMS HCC)    . Esophageal reflux    . 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 Fibromyalgia    . Gastroparesis    . Hypertension    . Hypothyroidism              Past Surgical History:     Past Surgical History:   Procedure Laterality Date   . ANKLE SURGERY     . HX CERVICAL POLYPECTOMY     . HX CHOLECYSTECTOMY               Family History:     Family Medical History:     Problem Relation (Age of Onset)    Cancer Father    Diabetes Mother    High Cholesterol Mother    Hypertension (High Blood Pressure) Mother    Thyroid Disease Mother                Social History:   Dawn Michael  reports that she has never smoked. She has never used smokeless tobacco. She reports current alcohol use. She reports being sexually active and has had partner(s) who are Female.          Review of Systems: Other than ROS in HPI, all other systems are negative.      Objective:   Vitals:    Vitals:    09/29/19 0806   BP: 110/72   Pulse: 72   Resp: 12   Temp: 36.1 C (97 F)   TempSrc: Thermal Scan    SpO2: 98%   Weight: 113 kg (250 lb)   Height: 1.727 m (5\' 8" )   BMI: 38.09          Body mass index is 38.01 kg/m.      Constitutional: Alert, well developed, well nourished  HEENT:  Head: NC/AT    Eyes: Sclera anicteric, conjunctiva not injected    Ears: EAC normal, bilateral TMs clear    Nose: No discharge    Throat: MMM, posterior pharynx without erythema or exudate  Neck:   Supple with normal ROM, no cervical LAD, no thyromegaly, no JVD, no carotid bruits  Cardiovascular: RRR, normal S1/S2, no murmurs/rubs/gallops  Pulmonary:  CTAB, equal air entry, nonlabored, no wheezes/crackles/rhonchi  Abdomen:   NABS, NT/ND, soft, no HSM, no masses  Musculoskeletal:  No deformity, no injury, no edema  Neurological:   Alert, oriented x 3, no abnormal tone  Skin:     Warm, pink, dry, no rashes, no jaundice, no pallor, no cyanosis  Psychiatric:  Normal mood, affect, behavior, judgment, and thought content        Assessment & Plan:   1. Type 2 diabetes mellitus without complication, without long-term current use of insulin (CMS HCC)  DC metformin    Start farxiga  - COMPREHENSIVE METABOLIC PNL, FASTING; Future  - Hemoglobin A1C; Future    2. Essential hypertension  Well controlled with current regimen which we will continue.      - COMPREHENSIVE METABOLIC PNL, FASTING; Future  - Hemoglobin A1C; Future    3. Acquired hypothyroidism    - COMPREHENSIVE METABOLIC PNL, FASTING; Future  - Hemoglobin A1C; Future    4. Diarrhea, unspecified type  prob second  metformin  Stop metformin              Health Maintenance   Topic Date Due   . HIV Screening  Never done   . Adult Tdap-Td (1 - Tdap) Never done   . Pneumococcal 19-64 Years Medium Risk (1 of 1 - PPSV23) Never done   . Pap smear  Never done   . Influenza Vaccine (Season Ended) 01/07/2020   . Hemoglobin A1C for Diabetes Control  09/12/2020       No follow-ups on file.    The patient has been educated and verbalized understanding regarding the services provided during this visit.       Bennie Hind, DO

## 2019-10-03 ENCOUNTER — Other Ambulatory Visit (INDEPENDENT_AMBULATORY_CARE_PROVIDER_SITE_OTHER): Payer: Self-pay | Admitting: Family Medicine

## 2019-10-03 NOTE — Telephone Encounter (Signed)
lov 09/29/2019

## 2019-10-07 ENCOUNTER — Ambulatory Visit (INDEPENDENT_AMBULATORY_CARE_PROVIDER_SITE_OTHER): Payer: Self-pay | Admitting: Family Medicine

## 2019-10-07 NOTE — Telephone Encounter (Signed)
This was phoned in

## 2019-10-07 NOTE — Telephone Encounter (Signed)
Regarding: prescription not being covered  ----- Message from Gerhard Munch sent at 10/07/2019 10:18 AM EDT -----  Bennie Hind, DO    Pharmacy said that the pt's insurance is no longer covering the accucheck test strips and lancets.  She said that it needs to be changed to One Touch Verio reflect meter.  One Touch verio test strips and one touch delica plus 33 gauge lancets.  90 day supply and whatever refills you would give.  Please call to advise.     Preferred Pharmacy     [facility] - [place], [place] - [address]   Portal to Registered [employer]    [address] [place] [address]    Phone: [phone] Fax: [phone]    Hours: Not open 24 hours

## 2019-10-09 ENCOUNTER — Other Ambulatory Visit (INDEPENDENT_AMBULATORY_CARE_PROVIDER_SITE_OTHER): Payer: Self-pay | Admitting: Family Medicine

## 2019-10-09 MED ORDER — LANCETS
1.00 [IU] | Freq: Two times a day (BID) | 12 refills | Status: AC
Start: 2019-10-09 — End: 2020-01-07

## 2019-10-09 MED ORDER — BLOOD SUGAR DIAGNOSTIC STRIPS
1.00 | ORAL_STRIP | Freq: Two times a day (BID) | 12 refills | Status: AC
Start: 2019-10-09 — End: ?

## 2019-10-09 NOTE — Telephone Encounter (Signed)
Regarding: Rx request  ----- Message from Zorita Pang Cogar sent at 10/08/2019  4:26 PM EDT -----  Doctor Name: Bennie Hind, DO       Date of last appointment: 09-29-19  Next scheduled visit: 12-30-19    Medication Requested:     Patient says she has to switch to ONE TOUCH VERIO test strips and LANCETS. She says she has received a letter from [facility] requesting you call them about these items.      Preferred Pharmacy     [facility] - [place], [place] - [address]   Portal to Registered [employer] Sites    [address] [place] [address]    Phone: [phone] Fax: [phone]    Hours: Not open 24 hours            Notes for Nurse or Physician:

## 2019-11-02 ENCOUNTER — Other Ambulatory Visit: Payer: Self-pay

## 2019-12-23 LAB — HGA1C (HEMOGLOBIN A1C WITH EST AVG GLUCOSE): HEMOGLOBIN A1C: 8.7

## 2019-12-30 ENCOUNTER — Other Ambulatory Visit: Payer: Self-pay

## 2019-12-30 ENCOUNTER — Encounter (INDEPENDENT_AMBULATORY_CARE_PROVIDER_SITE_OTHER): Payer: Self-pay | Admitting: Family Medicine

## 2019-12-30 ENCOUNTER — Ambulatory Visit (INDEPENDENT_AMBULATORY_CARE_PROVIDER_SITE_OTHER): Payer: BC Managed Care – PPO | Admitting: Family Medicine

## 2019-12-30 VITALS — BP 130/80 | HR 91 | Temp 97.0°F | Resp 14 | Wt 237.4 lb

## 2019-12-30 DIAGNOSIS — E119 Type 2 diabetes mellitus without complications: Secondary | ICD-10-CM

## 2019-12-30 DIAGNOSIS — Z79899 Other long term (current) drug therapy: Secondary | ICD-10-CM

## 2019-12-30 DIAGNOSIS — Z833 Family history of diabetes mellitus: Secondary | ICD-10-CM

## 2019-12-30 DIAGNOSIS — Z8249 Family history of ischemic heart disease and other diseases of the circulatory system: Secondary | ICD-10-CM

## 2019-12-30 DIAGNOSIS — I10 Essential (primary) hypertension: Secondary | ICD-10-CM

## 2019-12-30 DIAGNOSIS — K219 Gastro-esophageal reflux disease without esophagitis: Secondary | ICD-10-CM

## 2019-12-30 MED ORDER — DAPAGLIFLOZIN PROPANEDIOL 10 MG TABLET
10.0000 mg | ORAL_TABLET | Freq: Every day | ORAL | 1 refills | Status: AC
Start: 2019-12-30 — End: 2020-03-29

## 2019-12-30 MED ORDER — OZEMPIC 0.25 MG OR 0.5 MG (2 MG/1.5 ML) SUBCUTANEOUS PEN INJECTOR
0.5000 mg | PEN_INJECTOR | SUBCUTANEOUS | 3 refills | Status: DC
Start: 2019-12-30 — End: 2024-01-09

## 2019-12-30 NOTE — Progress Notes (Signed)
OUTPATIENT PROGRESS NOTE    Subjective:   Patient ID:  Dawn Michael is a pleasant 44 y.o. female.    Chief Complaint: Diabetes Follow up (Here for 3 month f/u visit stopped metformin change to farxiga 5 po q d not working well A1C 8.6 FBS 223  ), Hypertension, and Esophageal Reflux      History of Present Illness:  DM: Fasting FS range 223   Nonfasting FS range    Hypoglycemia episodes  No   Compliant with diabetic diet Yes   Sores on feet No  Diabetes Monitors  A1C: 2.9  A1C Date: 09/13/2019           Nephropathy Screening: On ACEI or ARB    Last Lipid Panel  (Last result in the past 2 years)      Cholesterol   HDL   LDL   Direct LDL   Triglycerides        09/13/19 0000 182 45 109   165         Retinal Exam Date: 07/02/2017  Last diabetic foot exam: 12/27/2018      HTN: HAs No    Dizziness No   Feeling like BP is too high or low No   Compliant with taking meds Yes   Home BP:  did not bring log  GERD well controlled    The history is provided by the patient.      Allergies:     Allergies   Allergen Reactions    Ceclor [Cefaclor]     Metformin     Trimox [Amoxicillin]     Wellbutrin [Bupropion Hcl]          Medications:   Blood Sugar Diagnostic (ONETOUCH VERIO TEST STRIPS) Strip, 1 Strip by Does not apply route Twice daily  Blood Sugar Diagnostic Strip, Accu-Chek Guide test strips, test twice daily  Cholecalciferol, Vitamin D3, (VITAMIN D-3) 5,000 unit Oral Tablet, Take by mouth  cyanocobalamin (VITAMIN B 12) 1,000 mcg Oral Tablet, Take 1,000 mcg by mouth Once a day  dapagliflozin (FARXIGA) 5 mg Oral Tablet, Take 5 mg by mouth Once a day  DEXILANT 60 mg Oral Cap, Delayed Rel., Multiphasic, Take 60 mg by mouth Twice daily   DULoxetine (CYMBALTA DR) 60 mg Oral Capsule, Delayed Release(E.C.), Take 60 mg by mouth Twice daily   folic acid (FOLVITE) 1 mg Oral Tablet, 1 mg Once a day   Lancets (ONETOUCH ULTRASOFT LANCETS) Misc, 1 Units by Does not apply route Twice daily for 90 days  Lancets Misc, Accu-chek fast clix  lancets, test twice daily  losartan-hydrochlorothiazide (HYZAAR) 50-12.5 mg Oral Tablet, TAKE 1 TABLET DAILY  metoclopramide HCl (REGLAN) 10 mg Oral Tablet, Take by mouth  ONETOUCH DELICA PLUS LANCET 33 gauge Does not apply Misc,   OZEMPIC 0.25 mg or 0.5 mg(2 mg/1.5 mL) Subcutaneous Pen Injector, INJECT 0.25MG               SUBCUTANEOUSLY EVERY 7 DAYS  SYNTHROID 50 mcg Oral Tablet, TAKE 1 TABLET EVERY MORNING  vitamin B complex (VITAMIN B COMPLEX) Oral Capsule, Take 1 Cap by mouth  VRAYLAR 1.5 mg Oral Capsule, take 1 capsule by mouth daily  ACCU-CHEK GUIDE Does not apply Strip,   Dexlansoprazole (DEXILANT) 60 mg Oral Cap, Delayed Rel., Multiphasic, TAKE 1 CAPSULE DAILY  MetFORMIN (GLUCOPHAGE) 1,000 mg Oral Tablet, TAKE 1 TABLET TWICE A DAY    No facility-administered medications prior to visit.        Immunization History:  There is no immunization history on file for this patient.      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 She has never used smokeless tobacco. She reports current alcohol use. She reports being sexually active and has had partner(s) who are female.          Review of Systems: Other than ROS in HPI, all other systems are negative.      Objective:   Vitals:    Vitals:    12/30/19 1144   BP:  130/80   Pulse: 91   Resp: 14   Temp: 36.1 C (97 F)   TempSrc: Thermal Scan   SpO2: 96%   Weight: 108 kg (237 lb 6.4 oz)          Body mass index is 36.1 kg/m.      Constitutional: Alert, well developed, well nourished  HEENT:  Head: NC/AT    Eyes: Sclera anicteric, conjunctiva not injected    Ears: EAC normal, bilateral TMs clear    Nose: No discharge    Throat: MMM, posterior pharynx without erythema or exudate  Neck:   Supple with normal ROM, no cervical LAD, no thyromegaly, no JVD, no carotid bruits  Cardiovascular: RRR, normal S1/S2, no murmurs/rubs/gallops  Pulmonary:  CTAB, equal air entry, nonlabored, no wheezes/crackles/rhonchi  Abdomen:   NABS, NT/ND, soft, no HSM, no masses  Musculoskeletal:  No deformity, no injury, no edema  Neurological:   Alert, oriented x 3, no abnormal tone  Skin:     Warm, pink, dry, no rashes, no jaundice, no pallor, no cyanosis  Psychiatric:  Normal mood, affect, behavior, judgment, and thought content    Diabetic foot exam:  No ulcerations or lesions bilaterally.          Assessment & Plan:     1. Type 2 diabetes mellitus without complication, without long-term current use of insulin (CMS HCC)  Continue current meds, including ACE-I and statin.  Urine microalbumin UTD.  Annual eye exam UTD.  Podiatry not indicated.  Dietary compliance stressed.  Recommend checking FS QD - BID.    - COMPREHENSIVE METABOLIC PNL, FASTING; Future  - LIPID PANEL; Future  - HGA1C (HEMOGLOBIN A1C WITH EST AVG GLUCOSE); Future  - THYROID STIMULATING HORMONE WITH FREE T4 REFLEX; Future    2. Essential hypertension  Well controlled with current regimen which we will continue.      - COMPREHENSIVE METABOLIC PNL, FASTING; Future  - LIPID PANEL; Future  - HGA1C (HEMOGLOBIN A1C WITH EST AVG GLUCOSE); Future  - THYROID STIMULATING HORMONE WITH FREE T4 REFLEX; Future    3. Gastro-esophageal reflux disease without esophagitis  stable            Health Maintenance   Topic Date Due    Covid-19 Vaccine (1 of 2)  Never done    HIV Screening  Never done    Adult Tdap-Td (1 - Tdap) Never done    Pneumococcal 19-64 Years Medium Risk (1 of 1 - PPSV23) Never done    Pap smear  Never done    Colonoscopy  Never done    Influenza Vaccine (1) 01/07/2020    Hemoglobin A1C for Diabetes  03/15/2020       No follow-ups on file.    The patient has been educated and verbalized understanding regarding the services provided during this visit.      Bennie Hind, DO

## 2020-01-26 ENCOUNTER — Other Ambulatory Visit (INDEPENDENT_AMBULATORY_CARE_PROVIDER_SITE_OTHER): Payer: Self-pay | Admitting: Family Medicine

## 2020-02-20 ENCOUNTER — Other Ambulatory Visit (INDEPENDENT_AMBULATORY_CARE_PROVIDER_SITE_OTHER): Payer: Self-pay | Admitting: Family Medicine

## 2020-03-08 ENCOUNTER — Other Ambulatory Visit (INDEPENDENT_AMBULATORY_CARE_PROVIDER_SITE_OTHER): Payer: Self-pay | Admitting: Family Medicine

## 2020-03-31 ENCOUNTER — Ambulatory Visit (INDEPENDENT_AMBULATORY_CARE_PROVIDER_SITE_OTHER): Payer: BC Managed Care – PPO | Admitting: Family Medicine

## 2020-03-31 ENCOUNTER — Encounter (INDEPENDENT_AMBULATORY_CARE_PROVIDER_SITE_OTHER): Payer: Self-pay | Admitting: Family Medicine

## 2020-03-31 ENCOUNTER — Other Ambulatory Visit: Payer: Self-pay

## 2020-03-31 VITALS — BP 120/80 | HR 86 | Temp 97.0°F | Resp 12 | Wt 240.8 lb

## 2020-03-31 DIAGNOSIS — F419 Anxiety disorder, unspecified: Secondary | ICD-10-CM

## 2020-03-31 DIAGNOSIS — E119 Type 2 diabetes mellitus without complications: Secondary | ICD-10-CM

## 2020-03-31 DIAGNOSIS — Z23 Encounter for immunization: Secondary | ICD-10-CM

## 2020-03-31 DIAGNOSIS — I10 Essential (primary) hypertension: Secondary | ICD-10-CM

## 2020-03-31 DIAGNOSIS — M797 Fibromyalgia: Secondary | ICD-10-CM

## 2020-03-31 DIAGNOSIS — E039 Hypothyroidism, unspecified: Secondary | ICD-10-CM

## 2020-03-31 MED ORDER — EMPAGLIFLOZIN 25 MG TABLET
25.0000 mg | ORAL_TABLET | Freq: Every day | ORAL | 1 refills | Status: AC
Start: 2020-03-31 — End: 2020-06-29

## 2020-03-31 NOTE — Nursing Note (Signed)
03/31/20 0700   Depression Screen   Little interest or pleasure in doing things. 1   Feeling down, depressed, or hopeless 0   PHQ 2 Total 1

## 2020-03-31 NOTE — Progress Notes (Signed)
OUTPATIENT PROGRESS NOTE    Subjective:   Patient ID:  Dawn Michael is a pleasant 44 y.o. female.    Chief Complaint: Hypertension (Here for f/u visit cont on meds to control losartan 50/12.5 po q d ), Diabetes Follow up (Cont on ozempic 0.5 po q d farxiga 10 po q d ), Hypothyroidism (Maintained on synthroid 50 po q d ), Anxiety (Cont on craylar 1.5 po q d  ), and Fibromyalgia (Cont on meds to control cymbalta po q d )      History of Present Illness:  HTN: HAs No    Dizziness No   Feeling like BP is too high or low No   Compliant with taking meds Yes   Home BP:  did not bring log  DM: Fasting FS range    Nonfasting FS range    Hypoglycemia episodes  No   Compliant with diabetic diet Yes   Sores on feet No  Diabetes Monitors  A1C: 8.7  A1C Date: 12/23/2019           Nephropathy Screening: On ACEI or ARB    Last Lipid Panel  (Last result in the past 2 years)      Cholesterol   HDL   LDL   Direct LDL   Triglycerides        09/13/19 0000 182   45   109     165           Retinal Exam Date: 02/17/2020  Last diabetic foot exam: 12/30/2019  Hypothyroidism: Hair changes No     Skin changes No     Diarrhea or constipation  No     Heat or cold intolerance  No     Palpitations No     Compliant with taking Synthroid Yes    Mood: Symptoms controlled   Yes   Compliant with taking medication Yes    Need for dosage or medication change No          The history is provided by the patient.      Allergies:     Allergies   Allergen Reactions    Ceclor [Cefaclor]     Metformin     Trimox [Amoxicillin]     Wellbutrin [Bupropion Hcl]          Medications:   Blood Sugar Diagnostic (ONETOUCH VERIO TEST STRIPS) Strip, 1 Strip by Does not apply route Twice daily  Blood Sugar Diagnostic Strip, Accu-Chek Guide test strips, test twice daily  Cholecalciferol, Vitamin D3, (VITAMIN D-3) 5,000 unit Oral Tablet, Take by mouth  cyanocobalamin (VITAMIN B 12) 1,000 mcg Oral Tablet, Take 1,000 mcg by mouth Once a day  DEXILANT 60 mg Oral Cap, Delayed  Rel., Multiphasic, Take 60 mg by mouth Twice daily   DULoxetine (CYMBALTA DR) 60 mg Oral Capsule, Delayed Release(E.C.), Take 60 mg by mouth Twice daily   folic acid (FOLVITE) 1 mg Oral Tablet, 1 mg Once a day   Lancets Misc, Accu-chek fast clix lancets, test twice daily  losartan-hydrochlorothiazide (HYZAAR) 50-12.5 mg Oral Tablet, TAKE 1 TABLET DAILY  metoclopramide HCl (REGLAN) 10 mg Oral Tablet, Take by mouth  ONETOUCH DELICA PLUS LANCET 33 gauge Does not apply Misc,   semaglutide (OZEMPIC) 0.25 mg or 0.5 mg(2 mg/1.5 mL) Subcutaneous Pen Injector, 0.5 mg by Subcutaneous route Every 7 days for 90 days  SYNTHROID 50 mcg Oral Tablet, TAKE 1 TABLET EVERY MORNING  vitamin B complex (VITAMIN B COMPLEX) Oral Capsule, Take  1 Cap by mouth  VRAYLAR 1.5 mg Oral Capsule, take 1 capsule by mouth daily    No facility-administered medications prior to visit.        Immunization History:     There is no immunization history on file for this patient.      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 She has never used smokeless tobacco. She reports current alcohol use. She reports being sexually active and has had partner(s) who are female.          Review of Systems: Other than ROS in HPI, all other systems are  negative.      Objective:   Vitals:    Vitals:    03/31/20 0754   BP: 120/80   Pulse: 86   Resp: 12   Temp: 36.1 C (97 F)   TempSrc: Thermal Scan   SpO2: 97%   Weight: 109 kg (240 lb 12.8 oz)          Body mass index is 36.61 kg/m.      Constitutional: Alert, well developed, well nourished  HEENT:  Head: NC/AT    Eyes: Sclera anicteric, conjunctiva not injected    Ears: EAC normal, bilateral TMs clear    Nose: No discharge    Throat: MMM, posterior pharynx without erythema or exudate  Neck:   Supple with normal ROM, no cervical LAD, no thyromegaly, no JVD, no carotid bruits  Cardiovascular: RRR, normal S1/S2, no murmurs/rubs/gallops  Pulmonary:  CTAB, equal air entry, nonlabored, no wheezes/crackles/rhonchi  Abdomen:   NABS, NT/ND, soft, no HSM, no masses  Musculoskeletal:  No deformity, no injury, no edema  Neurological:   Alert, oriented x 3, no abnormal tone  Skin:     Warm, pink, dry, no rashes, no jaundice, no pallor, no cyanosis  Psychiatric:  Normal mood, affect, behavior, judgment, and thought content    Diabetic foot exam:  No ulcerations or lesions bilaterally.          Assessment & Plan:   1. Essential hypertension  Well controlled with current regimen which we will continue.      - COMPREHENSIVE METABOLIC PNL, FASTING; Future  - LIPID PANEL; Future  - HGA1C (HEMOGLOBIN A1C WITH EST AVG GLUCOSE); Future  - CBC; Future    2. Type 2 diabetes mellitus without complication,  without long-term current use of insulin (CMS HCC)  Continue current meds, including ACE-I and statin.  Urine microalbumin UTD.  Annual eye exam UTD.  Podiatry not indicated.  Dietary compliance stressed.  Recommend checking FS QD - BID.  DcFarxiga and try jardiance  - COMPREHENSIVE METABOLIC PNL, FASTING; Future  - LIPID PANEL; Future  - HGA1C (HEMOGLOBIN A1C WITH EST AVG GLUCOSE); Future  - CBC; Future    3. Acquired hypothyroidism  stable  - COMPREHENSIVE METABOLIC PNL, FASTING; Future  - LIPID PANEL; Future  - HGA1C (HEMOGLOBIN A1C  WITH EST AVG GLUCOSE); Future  - CBC; Future    4. Anxiety  Stable on meds    5. Fibromyalgia  stable        The PHQ 2 Total: 1 depression screen is interpreted as positive and the follow up plan is Functional capacity/motivation questions validity of screening.      Health Maintenance   Topic Date Due    Covid-19 Vaccine (1) Never done    HIV Screening  Never done    Adult Tdap-Td (1 - Tdap) Never done    Pneumococcal 19-64 Years Medium Risk (1 of 1 - PPSV23) Never done    Pap smear  Never done    Colonoscopy  Never done    Influenza Vaccine (1) 01/07/2020    Hemoglobin A1C for Diabetes  03/24/2020    Diabetic Retinal Exam  02/16/2021    Depression Screening  03/31/2021       No follow-ups on file.    The patient has been educated and verbalized understanding regarding the services provided during this visit.      Bennie Hind, DO

## 2020-03-31 NOTE — Nursing Note (Signed)
[facility] [employer]     Patient received vaccine in clinic.  Tolerated it well, given VIS sheet and was discharged to home.  Immunization administered     Name Date Dose VIS Date Route    Influenza Vaccine, 6 month-adult 03/31/2020 0.5 mL 12/12/2019 Intramuscular    Site: Left deltoid    Given By: Hunchuck-Piccolomini, Melyssa Signor, MA    Manufacturer: [employer]    Lot: [id number]    NDC: [id number]          Dejana Pugsley Hunchuck-Piccolomini, MA  03/31/2020, 08:22

## 2020-05-12 ENCOUNTER — Ambulatory Visit (INDEPENDENT_AMBULATORY_CARE_PROVIDER_SITE_OTHER): Payer: BC Managed Care – PPO | Admitting: Family Medicine

## 2020-05-12 ENCOUNTER — Encounter (INDEPENDENT_AMBULATORY_CARE_PROVIDER_SITE_OTHER): Payer: Self-pay | Admitting: Family Medicine

## 2020-05-12 ENCOUNTER — Other Ambulatory Visit: Payer: Self-pay

## 2020-05-12 VITALS — BP 130/80 | HR 121 | Temp 98.0°F | Resp 14 | Wt 235.5 lb

## 2020-05-12 DIAGNOSIS — E119 Type 2 diabetes mellitus without complications: Secondary | ICD-10-CM

## 2020-05-12 DIAGNOSIS — B3789 Other sites of candidiasis: Secondary | ICD-10-CM

## 2020-05-12 MED ORDER — NYSTATIN 100,000 UNIT/GRAM TOPICAL CREAM
TOPICAL_CREAM | Freq: Two times a day (BID) | CUTANEOUS | 0 refills | Status: AC
Start: 2020-05-12 — End: 2020-05-22

## 2020-05-12 NOTE — Progress Notes (Signed)
OUTPATIENT PROGRESS NOTE    Subjective:   Patient ID:  Dawn Michael is a pleasant 45 y.o. female.    Chief Complaint: Rash (Here for wi appt rash under L breast using lotrisome cream improving however not completely gone. ) and Diabetes Follow up      History of Present Illness:  Red itchy sore rash under breasts since starting Jardiance.  DM: Fasting FS range    Nonfasting FS range    Hypoglycemia episodes  No   Compliant with diabetic diet No   Sores on feet No  Diabetes Monitors  A1C: 8.7  A1C Date: 12/23/2019           Nephropathy Screening: On ACEI or ARB    Last Lipid Panel  (Last result in the past 2 years)      Cholesterol   HDL   LDL   Direct LDL   Triglycerides        09/13/19 0000 182   45   109     165           Retinal Exam Date: 02/17/2020  Last diabetic foot exam: 03/31/2020          The history is provided by the patient.      Allergies:     Allergies   Allergen Reactions   . Ceclor [Cefaclor]    . Metformin    . Trimox [Amoxicillin]    . Wellbutrin [Bupropion Hcl]          Medications:   Blood Sugar Diagnostic (ONETOUCH VERIO TEST STRIPS) Strip, 1 Strip by Does not apply route Twice daily  Blood Sugar Diagnostic Strip, Accu-Chek Guide test strips, test twice daily  Cholecalciferol, Vitamin D3, (VITAMIN D-3) 5,000 unit Oral Tablet, Take by mouth  cyanocobalamin (VITAMIN B 12) 1,000 mcg Oral Tablet, Take 1,000 mcg by mouth Once a day  DEXILANT 60 mg Oral Cap, Delayed Rel., Multiphasic, Take 60 mg by mouth Twice daily   DULoxetine (CYMBALTA DR) 60 mg Oral Capsule, Delayed Release(E.C.), Take 60 mg by mouth Twice daily   empagliflozin (JARDIANCE) 25 mg Oral Tablet, Take 1 Tablet (25 mg total) by mouth Once a day for 90 days  folic acid (FOLVITE) 1 mg Oral Tablet, 1 mg Once a day   Lancets Misc, Accu-chek fast clix lancets, test twice daily  losartan-hydrochlorothiazide (HYZAAR) 50-12.5 mg Oral Tablet, TAKE 1 TABLET DAILY  metoclopramide HCl (REGLAN) 10 mg Oral Tablet, Take by mouth  ONETOUCH DELICA PLUS  LANCET 33 gauge Does not apply Misc,   semaglutide (OZEMPIC) 0.25 mg or 0.5 mg(2 mg/1.5 mL) Subcutaneous Pen Injector, 0.5 mg by Subcutaneous route Every 7 days for 90 days  SYNTHROID 50 mcg Oral Tablet, TAKE 1 TABLET EVERY MORNING  vitamin B complex (VITAMIN B COMPLEX) Oral Capsule, Take 1 Cap by mouth  VRAYLAR 1.5 mg Oral Capsule, take 1 capsule by mouth daily    No facility-administered medications prior to visit.        Immunization History:     Immunization History   Administered Date(s) Administered   . Influenza Vaccine, 6 month-adult 03/31/2020         Past Medical History:     Past Medical History:   Diagnosis Date   . Anxiety    . Depression    . Diabetes mellitus, type 2 (CMS HCC)    . Esophageal reflux    . 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 Fibromyalgia    .  Gastroparesis    . Hypertension    . Hypothyroidism              Past Surgical History:     Past Surgical History:   Procedure Laterality Date   . ANKLE SURGERY     . HX CERVICAL POLYPECTOMY     . HX CHOLECYSTECTOMY               Family History:     Family Medical History:     Problem Relation (Age of Onset)    Cancer Father    Diabetes Mother    High Cholesterol Mother    Hypertension (High Blood Pressure) Mother    Thyroid Disease Mother              Social History:   Dawn Michael  reports that she has never smoked. She has never used smokeless tobacco. She reports current alcohol use. She reports being sexually active and has had partner(s) who are female.          Review of Systems: Other than ROS in HPI, all other systems are negative.      Objective:   Vitals:    Vitals:    05/12/20 1359   BP: (!) 150/90   Pulse: (!) 121   Resp: 14   Temp: 36.7 C (98 F)   TempSrc: Thermal Scan   SpO2: 97%   Weight: 107 kg (235 lb 8 oz)          Body mass index is 35.81 kg/m.      Constitutional: Alert, well developed, well nourished  HEENT:  Head: NC/AT    Eyes: Sclera anicteric, conjunctiva not injected    Ears: EAC normal, bilateral TMs  clear    Nose: No discharge    Throat: MMM, posterior pharynx without erythema or exudate  Neck:   Supple with normal ROM, no cervical LAD, no thyromegaly, no JVD, no carotid bruits  Cardiovascular: RRR, normal S1/S2, no murmurs/rubs/gallops  Pulmonary:  CTAB, equal air entry, nonlabored, no wheezes/crackles/rhonchi  Abdomen:   NABS, NT/ND, soft, no HSM, no masses  Musculoskeletal:  No deformity, no injury, no edema  Neurological:   Alert, oriented x 3, no abnormal tone  Skin:     Warm, pink, dry, no rashes, no jaundice, no pallor, no cyanosis  Psychiatric:  Normal mood, affect, behavior, judgment, and thought content        Assessment & Plan:     1. Candidiasis of breast  nystatin    2. Type 2 diabetes mellitus without complication, without long-term current use of insulin (CMS HCC)  Careful glucose control and monitoring            Health Maintenance   Topic Date Due   . Covid-19 Vaccine (1) Never done   . HIV Screening  Never done   . Adult Tdap-Td (1 - Tdap) Never done   . Pneumococcal 19-64 Years Medium Risk (1 of 1 - PPSV23) Never done   . Pap smear  Never done   . Colonoscopy  Never done   . Diabetes A1C  03/24/2020   . Diabetic Retinal Exam  02/16/2021   . Depression Screening  03/31/2021   . Influenza Vaccine  Completed       No follow-ups on file.    The patient has been educated and verbalized understanding regarding the services provided during this visit.      Bennie Hind, DO

## 2020-05-26 ENCOUNTER — Other Ambulatory Visit: Payer: Self-pay

## 2020-06-12 ENCOUNTER — Other Ambulatory Visit (INDEPENDENT_AMBULATORY_CARE_PROVIDER_SITE_OTHER): Payer: Self-pay | Admitting: Family Medicine

## 2020-06-14 NOTE — Telephone Encounter (Signed)
Last Visit:05/12/20     Upcoming appointments: 07/01/2020      Karie Mainland [place], RN  06/14/2020, 14:26

## 2020-06-15 ENCOUNTER — Other Ambulatory Visit (INDEPENDENT_AMBULATORY_CARE_PROVIDER_SITE_OTHER): Payer: Self-pay | Admitting: Family Medicine

## 2020-06-15 MED ORDER — SYNTHROID 50 MCG TABLET
50.0000 ug | ORAL_TABLET | Freq: Every morning | ORAL | 1 refills | Status: AC
Start: 2020-06-15 — End: ?

## 2020-06-15 NOTE — Telephone Encounter (Signed)
Regarding: rx refill request  ----- Message from Lilli Few Force sent at 06/14/2020  4:38 PM EST -----  Bennie Hind, DO    LOV 05/12/20    NOV 07/01/20    Rx refill request - 90 day supply    SYNTHROID 50 mcg Oral Tablet    Preferred Pharmacy     [facility] - [place], [place] - [address]   Portal to Registered [employer] Sites    [address] [place] [address]    Phone: [phone] Fax: [phone]    Hours: Not open 24 hours

## 2020-07-01 ENCOUNTER — Ambulatory Visit (INDEPENDENT_AMBULATORY_CARE_PROVIDER_SITE_OTHER): Payer: Self-pay | Admitting: Family Medicine

## 2020-07-01 ENCOUNTER — Other Ambulatory Visit: Payer: Self-pay

## 2020-07-01 ENCOUNTER — Encounter (INDEPENDENT_AMBULATORY_CARE_PROVIDER_SITE_OTHER): Payer: Self-pay | Admitting: Family Medicine

## 2020-07-01 ENCOUNTER — Ambulatory Visit (INDEPENDENT_AMBULATORY_CARE_PROVIDER_SITE_OTHER): Payer: BC Managed Care – PPO | Admitting: Family Medicine

## 2020-07-01 VITALS — BP 120/82 | HR 112 | Temp 95.1°F | Ht 65.0 in | Wt 231.9 lb

## 2020-07-01 DIAGNOSIS — E119 Type 2 diabetes mellitus without complications: Secondary | ICD-10-CM

## 2020-07-01 DIAGNOSIS — I10 Essential (primary) hypertension: Secondary | ICD-10-CM

## 2020-07-01 DIAGNOSIS — E039 Hypothyroidism, unspecified: Secondary | ICD-10-CM

## 2020-07-01 DIAGNOSIS — F419 Anxiety disorder, unspecified: Secondary | ICD-10-CM

## 2020-07-01 NOTE — Telephone Encounter (Signed)
Regarding: fax lab and office notes  ----- Message from Lilli Few Force sent at 07/01/2020  2:53 PM EST -----  Bennie Hind, DO    [facility] would like the most recent A1C  And recent office notes. Fax [phone]

## 2020-07-01 NOTE — Progress Notes (Signed)
OUTPATIENT PROGRESS NOTE    Subjective:   Patient ID:  Dawn Michael is a pleasant 45 y.o. female.    Chief Complaint: Follow Up, Diabetes Follow up, Hypertension, and Hypothyroidism      History of Present Illness:  DM: Fasting FS range    Nonfasting FS range    Hypoglycemia episodes  Yes   Compliant with diabetic diet Yes   Sores on feet No  Diabetes Monitors  A1C: 8.7  A1C Date: 12/23/2019           Nephropathy Screening: On ACEI or ARB    Last Lipid Panel  (Last result in the past 2 years)      Cholesterol   HDL   LDL   Direct LDL   Triglycerides      09/13/19 0000 182   45   109     165          Retinal Exam Date: 02/17/2020  Last diabetic foot exam: 03/31/2020      HTN: HAs No    Dizziness No   Feeling like BP is too high or low No   Compliant with taking meds Yes   Home BP:  did not bring log  Hypothyroidism: Hair changes No     Skin changes No     Diarrhea or constipation  No     Heat or cold intolerance  No     Palpitations No     Compliant with taking Synthroid No    Mood: Symptoms controlled   Yes   Compliant with taking medication Yes    Need for dosage or medication change No      The history is provided by the patient.      Allergies:     Allergies   Allergen Reactions    Ceclor [Cefaclor]     Metformin     Trimox [Amoxicillin]     Wellbutrin [Bupropion Hcl]          Medications:   Blood Sugar Diagnostic (ONETOUCH VERIO TEST STRIPS) Strip, 1 Strip by Does not apply route Twice daily  Blood Sugar Diagnostic Strip, Accu-Chek Guide test strips, test twice daily  Cholecalciferol, Vitamin D3, (VITAMIN D-3) 5,000 unit Oral Tablet, Take by mouth  cyanocobalamin (VITAMIN B 12) 1,000 mcg Oral Tablet, Take 1,000 mcg by mouth Once a day  DEXILANT 60 mg Oral Cap, Delayed Rel., Multiphasic, Take 60 mg by mouth Twice daily   DULoxetine (CYMBALTA DR) 60 mg Oral Capsule, Delayed Release(E.C.), Take 60 mg by mouth Twice daily   folic acid (FOLVITE) 1 mg Oral Tablet, 1 mg Once a day   Lancets Misc, Accu-chek fast clix  lancets, test twice daily  losartan-hydrochlorothiazide (HYZAAR) 50-12.5 mg Oral Tablet, TAKE 1 TABLET DAILY  metoclopramide HCl (REGLAN) 10 mg Oral Tablet, Take by mouth  ONETOUCH DELICA PLUS LANCET 33 gauge Does not apply Misc,   semaglutide (OZEMPIC) 0.25 mg or 0.5 mg(2 mg/1.5 mL) Subcutaneous Pen Injector, 0.5 mg by Subcutaneous route Every 7 days for 90 days  SYNTHROID 50 mcg Oral Tablet, Take 1 Tablet (50 mcg total) by mouth Every morning  vitamin B complex (VITAMIN B COMPLEX) Oral Capsule, Take 1 Cap by mouth  VRAYLAR 1.5 mg Oral Capsule, take 1 capsule by mouth daily    No facility-administered medications prior to visit.        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 She has never used smokeless tobacco. She reports current alcohol use. She reports being sexually active and has had partner(s) who are female.          Review of Systems: Other than ROS in HPI, all other systems are negative.      Objective:   Vitals:    Vitals:    07/01/20 1054   BP: 120/82   Pulse: (!) 112   Temp: 35.1 C (95.1 F)   SpO2: 99%   Weight: 105 kg (231 lb 14.4 oz)   Height: 1.651 m (5\' 5" )    BMI: 38.67          Body mass index is 38.59 kg/m.      Constitutional: Alert, well developed, well nourished  HEENT:  Head: NC/AT    Eyes: Sclera anicteric, conjunctiva not injected    Ears: EAC normal, bilateral TMs clear    Nose: No discharge    Throat: MMM, posterior pharynx without erythema or exudate  Neck:   Supple with normal ROM, no cervical LAD, no thyromegaly, no JVD, no carotid bruits  Cardiovascular: RRR, normal S1/S2, no murmurs/rubs/gallops  Pulmonary:  CTAB, equal air entry, nonlabored, no wheezes/crackles/rhonchi  Abdomen:   NABS, NT/ND, soft, no HSM, no masses  Musculoskeletal:  No deformity, no injury, no edema  Neurological:   Alert, oriented x 3, no abnormal tone  Skin:     Warm, pink, dry, no rashes, no jaundice, no pallor, no cyanosis  Psychiatric:  Normal mood, affect, behavior, judgment, and thought content        Assessment & Plan:     1. Type 2 diabetes mellitus without complication, without long-term current use of insulin (CMS HCC)  Worsening coontrol despite max meds  - Refer to External Provider    2. Essential hypertension  Well controlled with current regimen which we will continue.        3. Acquired hypothyroidism  Stable  Refill synthroid    4. Anxiety  Improved on meds            Health Maintenance   Topic Date Due    Covid-19 Vaccine (1) Never done    Pneumococcal Vaccine, Age 1-64 based on risk (1 of 2 - PPSV23) Never done    HIV Screening  Never done    Adult Tdap-Td (1 - Tdap) Never done    Pap smear  Never done    Colonoscopy  Never done    Diabetes A1C  03/24/2020    Diabetic Retinal Exam  02/16/2021    Depression Screening  03/31/2021    Influenza Vaccine  Completed       No follow-ups on file.    The patient has been educated and verbalized understanding regarding the services provided during this visit.      Bennie Hind, DO

## 2020-07-01 NOTE — Telephone Encounter (Signed)
Faxed, confirmation received 

## 2020-07-08 DIAGNOSIS — E782 Mixed hyperlipidemia: Secondary | ICD-10-CM | POA: Insufficient documentation

## 2020-09-26 ENCOUNTER — Other Ambulatory Visit (INDEPENDENT_AMBULATORY_CARE_PROVIDER_SITE_OTHER): Payer: Self-pay | Admitting: Family Medicine

## 2020-09-27 NOTE — Telephone Encounter (Signed)
Last Visit:06/09/2020    Upcoming appointments: 12/29/2020      Hall Busing, RN  09/27/2020, 09:30

## 2020-12-06 ENCOUNTER — Ambulatory Visit (INDEPENDENT_AMBULATORY_CARE_PROVIDER_SITE_OTHER): Payer: Self-pay | Admitting: Family Medicine

## 2020-12-06 NOTE — Telephone Encounter (Signed)
Regarding: need blood work?  ----- Message from Gerhard Munch sent at 12/06/2020  3:44 PM EDT -----  Bennie Hind, DO    Pt has a 6 month f/u with you on 8/24.  She was wanting to check to see if you wanted her to get any blood work done before that appointment?  Please call to advise.

## 2020-12-29 ENCOUNTER — Encounter (INDEPENDENT_AMBULATORY_CARE_PROVIDER_SITE_OTHER): Payer: Self-pay | Admitting: Family Medicine

## 2021-01-25 ENCOUNTER — Other Ambulatory Visit (INDEPENDENT_AMBULATORY_CARE_PROVIDER_SITE_OTHER): Payer: Self-pay | Admitting: Family Medicine

## 2021-02-16 ENCOUNTER — Encounter (INDEPENDENT_AMBULATORY_CARE_PROVIDER_SITE_OTHER): Payer: Self-pay | Admitting: Family Medicine

## 2021-02-16 ENCOUNTER — Other Ambulatory Visit: Payer: Self-pay

## 2021-02-16 ENCOUNTER — Ambulatory Visit (INDEPENDENT_AMBULATORY_CARE_PROVIDER_SITE_OTHER): Payer: BC Managed Care – PPO | Admitting: Family Medicine

## 2021-02-16 VITALS — BP 130/82 | HR 84 | Temp 96.9°F | Resp 18 | Ht 65.0 in | Wt 253.2 lb

## 2021-02-16 DIAGNOSIS — Z6841 Body Mass Index (BMI) 40.0 and over, adult: Secondary | ICD-10-CM

## 2021-02-16 DIAGNOSIS — M797 Fibromyalgia: Secondary | ICD-10-CM

## 2021-02-16 DIAGNOSIS — E039 Hypothyroidism, unspecified: Secondary | ICD-10-CM

## 2021-02-16 DIAGNOSIS — E119 Type 2 diabetes mellitus without complications: Secondary | ICD-10-CM

## 2021-02-16 DIAGNOSIS — I10 Essential (primary) hypertension: Secondary | ICD-10-CM

## 2021-02-16 DIAGNOSIS — Z23 Encounter for immunization: Secondary | ICD-10-CM

## 2021-02-16 NOTE — Nursing Note (Signed)
Follow up   Patient states that she is currently taking shots as she was bit by a dog on 02/08/2021.   [facility]

## 2021-02-16 NOTE — Progress Notes (Signed)
OUTPATIENT PROGRESS NOTE    Subjective:   Patient ID:  Dawn Michael is a pleasant 45 y.o. female.    Chief Complaint: Follow Up, Diabetes Follow up (A1c 7.0), Hypertension (Well controlled), Flu Vaccine, and Hypothyroid (Stable TSH normal)      History of Present Illness:  HTN: HAs No    Dizziness No   Feeling like BP is too high or low No   Compliant with taking meds Yes   Home BP:  can't remember  Hypothyroidism: Hair changes No     Skin changes No     Diarrhea or constipation  No     Heat or cold intolerance  No     Palpitations No     Compliant with taking Synthroid Yes    DM: Fasting FS range    Nonfasting FS range 249   Hypoglycemia episodes  No   Compliant with diabetic diet Yes   Sores on feet No  Diabetes Monitors  A1C: 8.7  A1C Date: 12/23/2019           Nephropathy Screening: On ACEI or ARB    Last Lipid Panel  (Last result in the past 2 years)      Cholesterol   HDL   LDL   Direct LDL   Triglycerides      09/13/19 0000 182   45   109     165          Retinal Exam Date: 02/17/2020  Last diabetic foot exam: 03/31/2020      Needs flu vaaccine    The history is provided by the patient.      Allergies:     Allergies   Allergen Reactions   . Ceclor [Cefaclor]    . Metformin    . Trimox [Amoxicillin]    . Wellbutrin [Bupropion Hcl]          Medications:   Blood Sugar Diagnostic (ONETOUCH VERIO TEST STRIPS) Strip, 1 Strip by Does not apply route Twice daily  Blood Sugar Diagnostic Strip, Accu-Chek Guide test strips, test twice daily  DEXILANT 60 mg Oral Cap, Delayed Rel., Multiphasic, Take 60 mg by mouth Twice daily   DULoxetine (CYMBALTA DR) 60 mg Oral Capsule, Delayed Release(E.C.), Take 60 mg by mouth Twice daily   insulin glargine 100 unit/mL Subcutaneous Insulin Pen, Use to inject 28 units at night  Lancets Misc, Accu-chek fast clix lancets, test twice daily  losartan-hydrochlorothiazide (HYZAAR) 50-12.5 mg Oral Tablet, TAKE 1 TABLET DAILY  metoclopramide HCl (REGLAN) 10 mg Oral Tablet, Take by  mouth  ONETOUCH DELICA PLUS LANCET 33 gauge Does not apply Misc,   semaglutide (OZEMPIC) 0.25 mg or 0.5 mg(2 mg/1.5 mL) Subcutaneous Pen Injector, 0.5 mg by Subcutaneous route Every 7 days for 90 days  SYNTHROID 50 mcg Oral Tablet, Take 1 Tablet (50 mcg total) by mouth Every morning  VRAYLAR 1.5 mg Oral Capsule, take 1 capsule by mouth daily  Cholecalciferol, Vitamin D3, (VITAMIN D-3) 5,000 unit Oral Tablet, Take by mouth  cyanocobalamin (VITAMIN B 12) 1,000 mcg Oral Tablet, Take 1,000 mcg by mouth Once a day  folic acid (FOLVITE) 1 mg Oral Tablet, 1 mg Once a day   vitamin B complex (VITAMIN B COMPLEX) Oral Capsule, Take 1 Cap by mouth    No facility-administered medications prior to visit.        Immunization History:     Immunization History   Administered Date(s) Administered   . Influenza Vaccine, 6 month-adult 03/31/2020  Past Medical History:     Past Medical History:   Diagnosis Date   . Anxiety    . Depression    . Diabetes mellitus, type 2 (CMS HCC)    . Esophageal reflux    . 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 Fibromyalgia    . Gastroparesis    . Hypertension    . Hypothyroidism              Past Surgical History:     Past Surgical History:   Procedure Laterality Date   . ANKLE SURGERY     . HX CERVICAL POLYPECTOMY     . HX CHOLECYSTECTOMY               Family History:     Family Medical History:     Problem Relation (Age of Onset)    Cancer Father    Diabetes Mother    High Cholesterol Mother    Hypertension (High Blood Pressure) Mother    Thyroid Disease Mother              Social History:   Dawn Michael  reports that she has never smoked. She has never used smokeless tobacco. She reports current alcohol use. She reports being sexually active and has had partner(s) who are female.          Review of Systems: Other than ROS in HPI, all other systems are negative.      Objective:   Vitals:    Vitals:    02/16/21 0917   BP: 130/82   Pulse: 84   Resp: 18   Temp: 36.1 C (96.9 F)    TempSrc: Temporal   SpO2: 99%   Weight: 115 kg (253 lb 3.2 oz)   Height: 1.651 m (5\' 5" )   BMI: 42.22          Body mass index is 42.13 kg/m.      Constitutional: Alert, well developed, well nourished  HEENT:  Head: NC/AT    Eyes: Sclera anicteric, conjunctiva not injected    Ears: EAC normal, bilateral TMs clear    Nose: No discharge    Throat: MMM, posterior pharynx without erythema or exudate  Neck:   Supple with normal ROM, no cervical LAD, no thyromegaly, no JVD, no carotid bruits  Cardiovascular: RRR, normal S1/S2, no murmurs/rubs/gallops  Pulmonary:  CTAB, equal air entry, nonlabored, no wheezes/crackles/rhonchi  Abdomen:   NABS, NT/ND, soft, no HSM, no masses  Musculoskeletal:  No deformity, no injury, no edema  Neurological:   Alert, oriented x 3, no abnormal tone  Skin:     Warm, pink, dry, no rashes, no jaundice, no pallor, no cyanosis  Psychiatric:  Normal mood, affect, behavior, judgment, and thought content    Diabetic foot exam:  No ulcerations or lesions bilaterally.          Assessment & Plan:     1. Essential hypertension  Well controlled with current regimen which we will continue.        2. Acquired hypothyroidism  stbale    3. Type 2 diabetes mellitus without complication, without long-term current use of insulin (CMS HCC)  Continue current meds, including ACE-I and statin.  Urine microalbumin UTD.  Annual eye exam UTD.  Podiatry not indicated.  Dietary compliance stressed.  Recommend checking FS QD - BID.      4. Fibromyalgia  No change    5. Need for prophylactic vaccination and  inoculation against influenza      6. Morbid obesity with BMI of 45.0-49.9, adult (CMS HCC)  Recommend weight loss            Health Maintenance   Topic Date Due   . Covid-19 Vaccine (1) Never done   . Pneumococcal Vaccine, Age 69-64 (1 - PCV) Never done   . HIV Screening  Never done   . Adult Tdap-Td (1 - Tdap) Never done   . Pap smear  Never done   . Colonoscopy  Never done   . Diabetes A1C  03/24/2020   .  Influenza Vaccine (1) 01/06/2021   . Diabetic Retinal Exam  02/16/2021   . Depression Screening  03/31/2021   . Meningococcal Vaccine  Aged Out       No follow-ups on file.    The patient has been educated and verbalized understanding regarding the services provided during this visit.      Bennie Hind, DO

## 2021-03-10 ENCOUNTER — Other Ambulatory Visit (HOSPITAL_COMMUNITY): Payer: Self-pay | Admitting: Orthopaedic Surgery

## 2021-03-10 ENCOUNTER — Inpatient Hospital Stay
Admission: RE | Admit: 2021-03-10 | Discharge: 2021-03-10 | Disposition: A | Discharge status: Home / Self Care | Payer: BC Managed Care – PPO | Source: Ambulatory Visit | Attending: Orthopaedic Surgery | Admitting: Orthopaedic Surgery

## 2021-03-10 ENCOUNTER — Other Ambulatory Visit: Payer: Self-pay

## 2021-03-10 DIAGNOSIS — R52 Pain, unspecified: Secondary | ICD-10-CM | POA: Insufficient documentation

## 2021-03-30 ENCOUNTER — Other Ambulatory Visit (INDEPENDENT_AMBULATORY_CARE_PROVIDER_SITE_OTHER): Payer: Self-pay | Admitting: Family Medicine

## 2021-04-23 LAB — LIPID PANEL
CHOLESTEROL: 116
HDL-CHOLESTEROL: 38
LDL (CALCULATED): 55
NON - HDL (CALCULATED): 78
TRIGLYCERIDES: 157

## 2021-04-23 LAB — HGA1C (HEMOGLOBIN A1C WITH EST AVG GLUCOSE): HEMOGLOBIN A1C: 7.3

## 2021-04-27 ENCOUNTER — Other Ambulatory Visit: Payer: Self-pay

## 2021-04-27 ENCOUNTER — Other Ambulatory Visit (HOSPITAL_COMMUNITY): Payer: Self-pay | Admitting: Orthopaedic Surgery

## 2021-04-27 ENCOUNTER — Encounter (INDEPENDENT_AMBULATORY_CARE_PROVIDER_SITE_OTHER): Payer: Self-pay | Admitting: Family Medicine

## 2021-04-27 ENCOUNTER — Inpatient Hospital Stay
Admission: RE | Admit: 2021-04-27 | Discharge: 2021-04-27 | Disposition: A | Discharge status: Home / Self Care | Payer: BC Managed Care – PPO | Source: Ambulatory Visit | Attending: Orthopaedic Surgery | Admitting: Orthopaedic Surgery

## 2021-04-27 DIAGNOSIS — M25572 Pain in left ankle and joints of left foot: Secondary | ICD-10-CM

## 2021-05-19 ENCOUNTER — Encounter (INDEPENDENT_AMBULATORY_CARE_PROVIDER_SITE_OTHER): Payer: Self-pay | Admitting: Family Medicine

## 2021-06-02 ENCOUNTER — Other Ambulatory Visit (INDEPENDENT_AMBULATORY_CARE_PROVIDER_SITE_OTHER): Payer: Self-pay | Admitting: Family Medicine

## 2021-06-06 ENCOUNTER — Encounter (INDEPENDENT_AMBULATORY_CARE_PROVIDER_SITE_OTHER): Payer: Self-pay | Admitting: Family Medicine

## 2021-08-23 ENCOUNTER — Other Ambulatory Visit (INDEPENDENT_AMBULATORY_CARE_PROVIDER_SITE_OTHER): Payer: Self-pay | Admitting: Family Medicine

## 2021-08-23 NOTE — Telephone Encounter (Signed)
LOV 02/16/21  NOV 09/27/21

## 2021-09-01 ENCOUNTER — Encounter (INDEPENDENT_AMBULATORY_CARE_PROVIDER_SITE_OTHER): Payer: Self-pay | Admitting: Family Medicine

## 2021-09-13 ENCOUNTER — Encounter (INDEPENDENT_AMBULATORY_CARE_PROVIDER_SITE_OTHER): Payer: Self-pay | Admitting: Family Medicine

## 2021-09-27 ENCOUNTER — Other Ambulatory Visit: Payer: Self-pay

## 2021-09-27 ENCOUNTER — Encounter (INDEPENDENT_AMBULATORY_CARE_PROVIDER_SITE_OTHER): Payer: Self-pay | Admitting: Family Medicine

## 2021-09-27 ENCOUNTER — Ambulatory Visit (INDEPENDENT_AMBULATORY_CARE_PROVIDER_SITE_OTHER): Payer: BC Managed Care – PPO | Admitting: Family Medicine

## 2021-09-27 VITALS — BP 112/64 | HR 82 | Resp 18 | Ht 65.0 in | Wt 252.2 lb

## 2021-09-27 DIAGNOSIS — M797 Fibromyalgia: Secondary | ICD-10-CM

## 2021-09-27 DIAGNOSIS — E039 Hypothyroidism, unspecified: Secondary | ICD-10-CM

## 2021-09-27 DIAGNOSIS — G4730 Sleep apnea, unspecified: Secondary | ICD-10-CM

## 2021-09-27 DIAGNOSIS — E119 Type 2 diabetes mellitus without complications: Secondary | ICD-10-CM

## 2021-09-27 DIAGNOSIS — E785 Hyperlipidemia, unspecified: Secondary | ICD-10-CM

## 2021-09-27 DIAGNOSIS — I10 Essential (primary) hypertension: Secondary | ICD-10-CM

## 2021-09-27 NOTE — Nursing Note (Signed)
09/27/21 0800   Financial Resource Strain   How hard is it for you to pay for the very basics like food, housing, medical care, and heating? Not hard   Housing Stability   In the last 12 months, was there a time when you were not able to pay the mortgage or rent on time? N   In the last 12 months, how many places have you lived? 1   In the last 12 months, was there a time when you did not have a steady place to sleep or slept in a shelter (including now)? N   Transportation Needs   In the past 12 months, has lack of transportation kept you from medical appointments or from getting medications? no   In the past 12 months, has lack of transportation kept you from meetings, work, or from getting things needed for daily living? No   Social Connections   In a typical week, how many times do you talk on the phone with family, friends, or neighbors? More than 3   How often do you get together with friends or relatives? More than 3   How often do you attend church or religious services? Never   Do you belong to any clubs or organizations such as church groups, unions, fraternal or athletic groups, or school groups? No   How often do you attend meetings of the clubs or organizations you belong to? Never   Are you married, widowed, divorced, separated, never married, or living with a partner? Married   Intimate Partner Violence   Within the last year, have you been afraid of your partner or ex-partner? No   Within the last year, have you been humiliated or emotionally abused in other ways by your partner or ex-partner? No   Within the last year, have you been kicked, hit, slapped, or otherwise physically hurt by your partner or ex-partner? No   Within the last year, have you been raped or forced to have any kind of sexual activity by your partner or ex-partner? No

## 2021-09-27 NOTE — Progress Notes (Signed)
OUTPATIENT PROGRESS NOTE    Subjective:   Patient ID:  Dawn Michael is a pleasant 46 y.o. female.    Chief Complaint: Follow Up, Diabetes Follow up, Hypertension, Epigastric Pain, and Sleep Disorder      History of Present Illness:  HTN: HAs No    Dizziness No   Feeling like BP is too high or low No   Compliant with taking meds Yes   Home BP:  did not bring log  DM: Fasting FS range    Nonfasting FS range    Hypoglycemia episodes  No   Compliant with diabetic diet Yes   Sores on feet No  Diabetes Monitors  A1C: 7.3  A1C Date: 04/23/2021  Nephropathy Screening: Urine Microalbumin: Not Found Microalbumin Date: Not Found    Last Lipid Panel  (Last result in the past 2 years)      Cholesterol   HDL   LDL   Direct LDL   Triglycerides      04/23/21 0000 116   38   55     157          Retinal Exam Date: 02/17/2020  Last Foot Exam: 02/16/2021      Mood: Symptoms controlled   Yes   Compliant with taking medication Yes    Need for dosage or medication change No  Hypothyroidism: Hair changes No     Skin changes No     Diarrhea or constipation  No     Heat or cold intolerance  No     Palpitations No     Compliant with taking Synthroid Yes  HLD: tolerating cholosterol lowering medication     low fat/ cholesterol   Is pt fasting today? No  Needs new sleep lab as Dr Thera Flake retiring      The history is provided by the patient.      Allergies:     Allergies   Allergen Reactions   . Ceclor [Cefaclor]    . Metformin    . Trimox [Amoxicillin]    . Wellbutrin [Bupropion Hcl]          Medications:   Blood Sugar Diagnostic (ONETOUCH VERIO TEST STRIPS) Strip, 1 Strip by Does not apply route Twice daily  Blood Sugar Diagnostic Strip, Accu-Chek Guide test strips, test twice daily  DEXILANT 60 mg Oral Cap, Delayed Rel., Multiphasic, Take 60 mg by mouth Twice daily   DULoxetine (CYMBALTA DR) 60 mg Oral Capsule, Delayed Release(E.C.), Take 60 mg by mouth Twice daily   insulin glargine 100 unit/mL Subcutaneous Insulin Pen, Use to inject 28 units  at night  Lancets Misc, Accu-chek fast clix lancets, test twice daily  losartan-hydrochlorothiazide (HYZAAR) 50-12.5 mg Oral Tablet, TAKE 1 TABLET DAILY  metoclopramide HCl (REGLAN) 10 mg Oral Tablet, Take by mouth  ONETOUCH DELICA PLUS LANCET 33 gauge Does not apply Misc,   semaglutide (OZEMPIC) 0.25 mg or 0.5 mg(2 mg/1.5 mL) Subcutaneous Pen Injector, 0.5 mg by Subcutaneous route Every 7 days for 90 days  SYNTHROID 50 mcg Oral Tablet, Take 1 Tablet (50 mcg total) by mouth Every morning  VRAYLAR 1.5 mg Oral Capsule, take 1 capsule by mouth daily    No facility-administered medications prior to visit.        Immunization History:     Immunization History   Administered Date(s) Administered   . Influenza Vaccine, 6 month-adult 03/31/2020, 02/16/2021         Past Medical History:     Past Medical History:  Diagnosis Date   . Anxiety    . Depression    . Diabetes mellitus, type 2 (CMS HCC)    . Esophageal reflux    . 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 Fibromyalgia    . Gastroparesis    . Hypertension    . Hypothyroidism              Past Surgical History:     Past Surgical History:   Procedure Laterality Date   . ANKLE SURGERY     . HX CERVICAL POLYPECTOMY     . HX CHOLECYSTECTOMY               Family History:     Family Medical History:     Problem Relation (Age of Onset)    Cancer Father    Diabetes Mother    High Cholesterol Mother    Hypertension (High Blood Pressure) Mother    Thyroid Disease Mother              Social History:   Dawn Michael  reports that she has never smoked. She has never used smokeless tobacco. She reports current alcohol use. She reports being sexually active and has had partner(s) who are female.          Review of Systems: Other than ROS in HPI, all other systems are negative.      Objective:   Vitals:    Vitals:    09/27/21 0846   BP: 112/64   Pulse: 82   Resp: 18   SpO2: 99%   Weight: 114 kg (252 lb 3.2 oz)   Height: 1.651 m ('5\' 5"'$ )   BMI: 42.06          Body mass index  is 41.97 kg/m.      Constitutional: Alert, well developed, well nourished  HEENT:  Head: NC/AT    Eyes: Sclera anicteric, conjunctiva not injected    Ears: EAC normal, bilateral TMs clear    Nose: No discharge    Throat: MMM, posterior pharynx without erythema or exudate  Neck:   Supple with normal ROM, no cervical LAD, no thyromegaly, no JVD, no carotid bruits  Cardiovascular: RRR, normal S1/S2, no murmurs/rubs/gallops  Pulmonary:  CTAB, equal air entry, nonlabored, no wheezes/crackles/rhonchi  Abdomen:   NABS, NT/ND, soft, no HSM, no masses  Musculoskeletal:  No deformity, no injury, no edema  Neurological:   Alert, oriented x 3, no abnormal tone  Skin:     Warm, pink, dry, no rashes, no jaundice, no pallor, no cyanosis  Psychiatric:  Normal mood, affect, behavior, judgment, and thought content        Assessment & Plan:     1. Type 2 diabetes mellitus without complication, without long-term current use of insulin (CMS HCC)  Continue current meds, including ACE-I and statin.  Urine microalbumin UTD.  Annual eye exam UTD.  Podiatry not indicated.  Dietary compliance stressed.  Recommend checking FS QD - BID.    - COMPREHENSIVE METABOLIC PNL, FASTING; Future  - LIPID PANEL; Future  - HGA1C (HEMOGLOBIN A1C WITH EST AVG GLUCOSE); Future  - THYROID STIMULATING HORMONE WITH FREE T4 REFLEX; Future  - MICROALBUMIN/CREATININE RATIO, URINE, RANDOM; Future    2. Essential hypertension  Well controlled with current regimen which we will continue.      - COMPREHENSIVE METABOLIC PNL, FASTING; Future  - LIPID PANEL; Future  - HGA1C (HEMOGLOBIN A1C WITH EST AVG GLUCOSE); Future  -  THYROID STIMULATING HORMONE WITH FREE T4 REFLEX; Future  - MICROALBUMIN/CREATININE RATIO, URINE, RANDOM; Future    3. Acquired hypothyroidism    - COMPREHENSIVE METABOLIC PNL, FASTING; Future  - LIPID PANEL; Future  - HGA1C (HEMOGLOBIN A1C WITH EST AVG GLUCOSE); Future  - THYROID STIMULATING HORMONE WITH FREE T4 REFLEX; Future  - MICROALBUMIN/CREATININE  RATIO, URINE, RANDOM; Future    4. Sleep apnea, unspecified type    - Refer to [facility]; Future    5. Fibromyalgia  Refill cymbalta    6. Hyperlipidemia, unspecified hyperlipidemia type    - LIPID PANEL; Future            Health Maintenance   Topic Date Due   . Hepatitis B Vaccine (1 of 3 - 3-dose series) Never done   . Covid-19 Vaccine (1) Never done   . Pneumococcal Vaccine, Age 80-64 (1 - PCV) Never done   . HIV Screening  Never done   . Diabetic Kidney Health eGFR  Never done   . Diabetic Kidney Health Microalb/Cr Ratio  Never done   . Adult Tdap-Td (1 - Tdap) Never done   . Pap smear  Never done   . Colonoscopy  Never done   . Diabetic Retinal Exam  02/16/2021   . Diabetic A1C  10/22/2021   . Depression Screening  09/28/2022   . Influenza Vaccine  Completed   . Meningococcal Vaccine  Aged Out       No follow-ups on file.    The patient has been educated and verbalized understanding regarding the services provided during this visit.      Philmore Pali, DO

## 2021-10-25 ENCOUNTER — Other Ambulatory Visit (INDEPENDENT_AMBULATORY_CARE_PROVIDER_SITE_OTHER): Payer: Self-pay | Admitting: Family Medicine

## 2021-10-26 NOTE — Telephone Encounter (Signed)
Last Visit:09/29/2019     Upcoming appointments: 03/24/2022          Talbot Grumbling, RN  10/26/2021, 16:02

## 2021-12-01 ENCOUNTER — Ambulatory Visit (HOSPITAL_COMMUNITY): Payer: Self-pay | Admitting: Student in an Organized Health Care Education/Training Program

## 2022-01-18 ENCOUNTER — Encounter (INDEPENDENT_AMBULATORY_CARE_PROVIDER_SITE_OTHER): Payer: Self-pay | Admitting: Family Medicine

## 2022-02-05 ENCOUNTER — Other Ambulatory Visit (INDEPENDENT_AMBULATORY_CARE_PROVIDER_SITE_OTHER): Payer: Self-pay | Admitting: Family Medicine

## 2022-03-24 ENCOUNTER — Encounter (INDEPENDENT_AMBULATORY_CARE_PROVIDER_SITE_OTHER): Payer: Self-pay | Admitting: Family Medicine

## 2022-03-29 ENCOUNTER — Encounter (HOSPITAL_COMMUNITY): Payer: Self-pay | Admitting: Student in an Organized Health Care Education/Training Program

## 2022-03-29 ENCOUNTER — Other Ambulatory Visit: Payer: Self-pay

## 2022-03-29 ENCOUNTER — Ambulatory Visit
Payer: BC Managed Care – PPO | Attending: Student in an Organized Health Care Education/Training Program | Admitting: Student in an Organized Health Care Education/Training Program

## 2022-03-29 VITALS — BP 120/68 | HR 111 | Temp 98.2°F | Resp 18 | Ht 65.0 in | Wt 240.0 lb

## 2022-03-29 DIAGNOSIS — Z9989 Dependence on other enabling machines and devices: Secondary | ICD-10-CM

## 2022-03-29 DIAGNOSIS — G4733 Obstructive sleep apnea (adult) (pediatric): Secondary | ICD-10-CM | POA: Insufficient documentation

## 2022-03-29 NOTE — Nursing Note (Signed)
03/29/22 1400   Situation   Sitting and Reading 0   Watching TV 1   Sitting inactive in a public place. 0   As a passenger in a car for an hour without a break. 0   Lying down to rest in the afternoon when circumstances permit. 1   Sitting and Talking to someone 0   Sitting quietly after a lunch without alcohol 0   In a car, while stopped for a few minutes in traffic 0   Epworth Sleepiness Scale Score   Score total 2

## 2022-03-29 NOTE — H&P (Signed)
PULMONARY AND SLEEP MEDICINE, [facility]  Operated by [facility]  [address] [place] [address]  Dept: [phone]  Dept Fax: [phone]     INITIAL CONSULTATION HISTORY AND PHYSICAL    Name: Dawn Michael MRN: [id number]   DOB: [birth year] Age: 46 y.o.       Referring physician: No referring provider defined for this encounter.    Date of Visit: 03/29/2022    CHIEF COMPLAINT: OSA    IMPRESSION:  46 y.o. female with HTN, DM2, GERD, fibromyalgia, depression, and:  Severe OSA on CPAP with good adherence and good clinical response  Inadequate sleep hygiene    The risks of untreated OSA including increased risk for cardiovascular and cerebrovascular disease were discussed.    PLAN:  OSA  - Continue CPAP 13 cm H2O with nasal mask, DME DHS  - Patient counseled to obtain at least 7-8 hours of sleep each night, avoid alcohol and electronic screen exposure prior to bedtime, avoid caffeine intake after 12 noon, maintain restful bedtime environment, utilize bed only for sleep and sex  - Weight management encouraged    Return in about 1 year (around 03/30/2023) for In Person Visit.    SLEEP STUDY RESULTS: PSG done on 11/20/2011 showed AHI 41. CPAP titration 12/02/2011 showed effective CPAP 13.      HISTORY:  Dawn Michael is an 46 y.o. female with HTN, DM2, GERD, fibromyalgia, depression, who presents with complaints of OSA on CPAP.    Sleep related symptoms: OSA diagnosed 2013 due to fatigue. Currently using ResVent iBreeze CPAP with nasal mask. DME DHS. Husband works 2 jobs and is not consistently in the bed which disturbs sleep.     Symptom Comment   x Snoring    - Gasping/choking    - Witnessed apneas    - Sleep talking (somniloquy)    - Sleep walking (somnambulism)    - Sleep eating    - Dream enactment    - Hypnagogic/hypnopompic hallucinations    - Nightmares    x Dry mouth on awakening    - AM headaches    - Teeth grinding    - Restless legs    - Sleep paralysis    - Cataplexy    -  Cognitive difficulty    - Prominent afternoon dip      Epworth sleepiness score: 2/24    Psychiatric medications: Capirazine, duloxetine    Neurologic medications: None    Sedative hypnotics: None    Other sleep aids: None    Stimulant medications: None    Anti-hypertensives: Losartan, HCTZ     Weekdays Weekends   Bedtime 930 PM - 12 MN //   Sleep latency Variable    Nocturnal awakenings Variable    Return latency     Wake up time 630 AM     Naps 7-12 noon    Sleep hygiene: Crotchets before bed    ROS:   Denies shortness of breath / chest pain / cough / fever / nausea / headache  Otherwise negative except as in HPI or mentioned above    SOCIAL HISTORY  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 Bulky tongue. Mallampatti 4. Tonsils 1+ BL. No dental malocclusion. No retrognathia or micrognathia. Nasal septum not significantly deviated.  Neck: Trachea in the midline. Thyromental distance WNL.   Cardiovascular: S1-S2 regular. No murmur.  Lungs: Equal air entry bilaterally. No rales rhonchi or wheezing.  Abdomen: Soft, nontender. No rebound or guarding. Active bowel sounds.  Musculoskeletal: No joint swelling or erythema or tenderness  Extremities: No edema clubbing or cyanosis.  Skin: Dry, warm to touch. Normal turgor.   Neurological: Awake, alert , oriented. Nonfocal.  Psychiatric: Normal mood and affect.      IMAGING STUDIES (PERSONALLY REVIEWED):  No chest imaging available for review.    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)      Fibromyalgia     Gastroparesis     Hypertension     Hypothyroidism      Past Surgical History:   Procedure Laterality Date    ANKLE SURGERY      HX CERVICAL POLYPECTOMY      HX CHOLECYSTECTOMY       Family History   Problem Relation Name Age of Onset    Diabetes Mother      High Cholesterol Mother      Hypertension (High Blood Pressure) Mother      Thyroid Disease Mother      Cancer Father       Social History     Socioeconomic History    Marital status: Married   Tobacco Use    Smoking status: Never    Smokeless tobacco: Never   Vaping Use    Vaping Use: Never used   Substance and Sexual Activity    Alcohol use: Yes     Comment: Occasional     Drug use: Never    Sexual activity: Yes     Partners: Male     Social Determinants of Health     Financial Resource Strain: Low Risk  (03/29/2022)    Financial Resource Strain     SDOH Financial: No   Transportation Needs: Low Risk  (03/29/2022)    Transportation Needs     SDOH Transportation: No   Social Connections: Medium Risk (03/29/2022)    Social Connections     SDOH Social Isolation: 1 or 2 times a week   Intimate Partner Violence: Low Risk  (03/29/2022)    Intimate Partner Violence     SDOH Domestic Violence: No   Housing Stability: Low Risk  (03/29/2022)    Housing Stability     SDOH Housing Situation: I have housing.     SDOH Housing Worry: No     Allergies   Allergen Reactions    Ceclor [Cefaclor]     Trimox [Amoxicillin]     Wellbutrin [Bupropion Hcl]      Current Outpatient Medications   Medication Instructions    atorvastatin (LIPITOR) 10 mg, Oral    Blood Sugar Diagnostic (ONETOUCH VERIO TEST STRIPS) Strip 1 Strip, Does not apply, 2 TIMES DAILY    Blood Sugar Diagnostic Strip Accu-Chek Guide test strips, test twice daily    DULoxetine (CYMBALTA DR) 60 mg, Oral,  2 TIMES DAILY    folic acid (FOLVITE) 1 mg Oral Tablet     glimepiride (AMARYL) 4 mg Oral Tablet     insulin glargine 100 unit/mL Subcutaneous Insulin Pen Use to inject 28 units at night    Lancets  Misc Accu-chek fast clix lancets, test twice daily    losartan-hydrochlorothiazide (HYZAAR) 50-12.5 mg Oral Tablet TAKE 1 TABLET DAILY    metFORMIN (GLUCOPHAGE XR) 500 mg Oral Tablet Sustained Release 24 hr     metoclopramide HCl (REGLAN) 10 mg Oral Tablet Oral    ONETOUCH DELICA PLUS LANCET 33 gauge Does not apply Misc No dose, route, or frequency recorded.    Ozempic 0.5 mg, Subcutaneous, EVERY 7 DAYS    pantoprazole (PROTONIX) 40 mg Oral Tablet, Delayed Release (E.C.) 1 Tablet, Oral, DAILY    Synthroid 50 mcg, Oral, EVERY MORNING    VRAYLAR 1.5 mg Oral Capsule take 1 capsule by mouth daily       Objective   LABORATORY FINDINGS:          IMAGING STUDIES:          Problem List Items Addressed This Visit          Respiratory    OSA on CPAP - Primary    Relevant Orders    DME - HOME PAP (BIPAP/CPAP) DEVICE OR SUPPLIES (POSITIVE AIRWAY PRESSURE DEVICE)       Total time spent on encounter was 46 minutes. In addition to face to face time with the patient performing history and exam, this includes time spent before and after the visit reviewing records, personally reviewing prior imaging, reviewing prior cardiac studies as available, reviewing prior outpatient notes as available, reviewing the patient's medication record, coordinating with office staff and time spent performing final documentation in the medical record. This excludes procedural time.    Kateri Plummer, MD    This note is structured for readability, and certain sections have been moved accordingly.    Parts of this note were dictated using 71M (tm) M*Modal Fluency Direct, and may contain unintended transcription errors despite review.

## 2022-03-29 NOTE — Patient Instructions (Signed)
-   Continue AutoCPAP  - Patient counseled to obtain at least 7-8 hours of sleep each night, avoid alcohol and electronic screen exposure prior to bedtime, avoid caffeine intake after 12 noon, maintain restful bedtime environment, utilize bed only for sleep and sex  - Weight management encouraged    Sleep apnea is a condition that can lead to excessive daytime sleepiness. For the safety of yourself, as well as others, it is strongly recommended not to drive or operate heavy machinery when drowsy or sleepy.

## 2022-03-31 ENCOUNTER — Ambulatory Visit (HOSPITAL_COMMUNITY): Payer: Self-pay | Admitting: Student in an Organized Health Care Education/Training Program

## 2022-04-12 LAB — THYROID STIMULATING HORMONE WITH FREE T4 REFLEX: THYROID STIMULATING HORMONE WITH FREE T4 REFLEX: 3.54

## 2022-04-12 LAB — COMPREHENSIVE METABOLIC PNL, FASTING
ALBUMIN: 4.4
ALKALINE PHOSPHATASE: 106
ALT (SGPT): 16
AST (SGOT): 11
BILIRUBIN, TOTAL: 0.7
BUN: 12
CALCIUM: 9.6
CARBON DIOXIDE: 26
CHLORIDE: 100
CREATININE: 0.78
ESTIMATED GLOMERULAR FILTRATION RATE: 94
GLUCOSE, FASTING: 116
POTASSIUM: 4.2
SODIUM: 137
TOTAL PROTEIN: 6.9

## 2022-04-12 LAB — LIPID PANEL
CHOLESTEROL: 115
HDL-CHOLESTEROL: 38
LDL (CALCULATED): 58
NON - HDL (CALCULATED): 77
TRIGLYCERIDES: 106
VLDL (CALCULATED): 3

## 2022-04-12 LAB — MICROALBUMIN/CREATININE RATIO, URINE, RANDOM
CREATININE, UR RAND: 60
MICROALBUMIN RANDOM URINE: 0.2
MICROALBUMIN/CREAT RATIO: 3

## 2022-04-12 LAB — HGA1C (HEMOGLOBIN A1C WITH EST AVG GLUCOSE)
ESTIMATED AVERAGE GLUCOSE: 116
HEMOGLOBIN A1C: 6.2

## 2022-04-13 LAB — LIPID PANEL
CHOLESTEROL: 115
HDL-CHOLESTEROL: 38
LDL (CALCULATED): 58
NON - HDL (CALCULATED): 77
TRIGLYCERIDES: 106
VLDL (CALCULATED): 3

## 2022-04-13 LAB — HGA1C (HEMOGLOBIN A1C WITH EST AVG GLUCOSE)
ESTIMATED AVERAGE GLUCOSE: 116
HEMOGLOBIN A1C: 6.2

## 2022-04-13 LAB — COMPREHENSIVE METABOLIC PNL, FASTING
ALBUMIN: 4.4
ALKALINE PHOSPHATASE: 106
ALT (SGPT): 16
AST (SGOT): 11
BILIRUBIN, TOTAL: 0.7
BUN: 12
CALCIUM: 9.6
CARBON DIOXIDE: 26
CHLORIDE: 100
CREATININE: 0.78
POTASSIUM: 4.2
SODIUM: 137
TOTAL PROTEIN: 6.9

## 2022-04-14 ENCOUNTER — Other Ambulatory Visit (INDEPENDENT_AMBULATORY_CARE_PROVIDER_SITE_OTHER): Payer: Self-pay

## 2022-04-14 DIAGNOSIS — E119 Type 2 diabetes mellitus without complications: Secondary | ICD-10-CM

## 2022-04-14 DIAGNOSIS — I10 Essential (primary) hypertension: Secondary | ICD-10-CM

## 2022-04-14 DIAGNOSIS — E039 Hypothyroidism, unspecified: Secondary | ICD-10-CM

## 2022-04-14 DIAGNOSIS — E785 Hyperlipidemia, unspecified: Secondary | ICD-10-CM

## 2022-04-18 ENCOUNTER — Other Ambulatory Visit (INDEPENDENT_AMBULATORY_CARE_PROVIDER_SITE_OTHER): Payer: Self-pay

## 2022-04-18 ENCOUNTER — Ambulatory Visit (INDEPENDENT_AMBULATORY_CARE_PROVIDER_SITE_OTHER): Payer: BC Managed Care – PPO | Admitting: Family Medicine

## 2022-04-18 ENCOUNTER — Encounter (INDEPENDENT_AMBULATORY_CARE_PROVIDER_SITE_OTHER): Payer: Self-pay | Admitting: Family Medicine

## 2022-04-18 ENCOUNTER — Other Ambulatory Visit: Payer: Self-pay

## 2022-04-18 VITALS — BP 126/68 | HR 100 | Temp 97.4°F | Resp 18 | Ht 65.0 in | Wt 234.1 lb

## 2022-04-18 DIAGNOSIS — I10 Essential (primary) hypertension: Secondary | ICD-10-CM

## 2022-04-18 DIAGNOSIS — E119 Type 2 diabetes mellitus without complications: Secondary | ICD-10-CM

## 2022-04-18 DIAGNOSIS — E785 Hyperlipidemia, unspecified: Secondary | ICD-10-CM

## 2022-04-18 DIAGNOSIS — Z7984 Long term (current) use of oral hypoglycemic drugs: Secondary | ICD-10-CM

## 2022-04-18 DIAGNOSIS — G500 Trigeminal neuralgia: Secondary | ICD-10-CM

## 2022-04-18 DIAGNOSIS — Z794 Long term (current) use of insulin: Secondary | ICD-10-CM

## 2022-04-18 DIAGNOSIS — E039 Hypothyroidism, unspecified: Secondary | ICD-10-CM

## 2022-04-18 MED ORDER — METHYLPREDNISOLONE 4 MG TABLETS IN A DOSE PACK
ORAL_TABLET | ORAL | 0 refills | Status: DC
Start: 2022-04-18 — End: 2022-10-18

## 2022-04-18 NOTE — Nursing Note (Signed)
04/18/22 1100   Recent Weight Change   Have you had a recent unexplained weight loss or gain? N   Domestic Violence   Because we are aware of abuse and domestic violence today, we ask all patients: Are you being hurt, hit, or frightened by anyone at your home or in your life?  N   Basic Needs   Do you have any basic needs within your home that are not being met? (such as Food, Shelter, Games developer, Tranportation, paying for bills and/or medications) N   Advanced Directives   Do you have any advanced directives? No Advance   Would you like an advanced directive packet? Refused Packet

## 2022-04-18 NOTE — Progress Notes (Signed)
OUTPATIENT PROGRESS NOTE    Subjective:   Patient ID:  Dawn Michael is a pleasant 46 y.o. female.    Chief Complaint: Essential Hypertension, Diabetes, and Trigeminal Neuralgia      History of Present Illness:  DM: Fasting FS range    Nonfasting FS range    Hypoglycemia episodes  No   Compliant with diabetic diet Yes   Sores on feet No  Diabetes Monitors  A1C: 6.2  A1C Date: 04/13/2022  Kidney Health:   Urine Microalbumin/Cr Ratio--3 Ur Microalb/Cr Ratio date--04/12/2022   eGFR --94 eGFR date--04/12/2022    Last Lipid Panel  (Last result in the past 2 years)        Cholesterol   HDL   LDL   Direct LDL   Triglycerides      04/13/22 0000 115   38   58     106            Retinal Exam Date: 01/17/2022 retinopathy status not documented  Last Foot Exam: 02/16/2021  HTN: HAs No    Dizziness No   Feeling like BP is too high or low No   Compliant with taking meds Yes   Home BP:  did not bring log  Trigeminal neuralgia on rt side of face about 1 week ago without trauma Now having electric zaps in right face that is quite debilitatiing.  HLD: tolerating cholosterol lowering medication     low fat/ cholesterol   Is pt fasting today? No    The history is provided by the patient.      Allergies:     Allergies   Allergen Reactions    Ceclor [Cefaclor]     Trimox [Amoxicillin]     Wellbutrin [Bupropion Hcl]          Medications:   atorvastatin (LIPITOR) 10 mg Oral Tablet, Take 1 Tablet (10 mg total) by mouth  Blood Sugar Diagnostic (ONETOUCH VERIO TEST STRIPS) Strip, 1 Strip by Does not apply route Twice daily  DULoxetine (CYMBALTA DR) 60 mg Oral Capsule, Delayed Release(E.C.), Take 1 Capsule (60 mg total) by mouth Twice daily  folic acid (FOLVITE) 1 mg Oral Tablet,   glimepiride (AMARYL) 4 mg Oral Tablet,   insulin glargine 100 unit/mL Subcutaneous Insulin Pen, Use to inject 28 units at night  Lancets Misc, Accu-chek fast clix lancets, test twice daily  losartan-hydrochlorothiazide (HYZAAR) 50-12.5 mg Oral Tablet, TAKE 1 TABLET  DAILY  metFORMIN (GLUCOPHAGE XR) 500 mg Oral Tablet Sustained Release 24 hr,   metoclopramide HCl (REGLAN) 10 mg Oral Tablet, Take by mouth  ONETOUCH DELICA PLUS LANCET 33 gauge Does not apply Misc,   semaglutide (OZEMPIC) 0.25 mg or 0.5 mg(2 mg/1.5 mL) Subcutaneous Pen Injector, 0.5 mg by Subcutaneous route Every 7 days for 90 days  SYNTHROID 50 mcg Oral Tablet, Take 1 Tablet (50 mcg total) by mouth Every morning  VRAYLAR 1.5 mg Oral Capsule, take 1 capsule by mouth daily  Blood Sugar Diagnostic Strip, Accu-Chek Guide test strips, test twice daily    No facility-administered medications prior to visit.        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 She has never used smokeless tobacco. She reports current alcohol use. She reports being sexually active and has had partner(s) who are female. She reports that she does not use drugs.          Review of Systems: Other than ROS in HPI, all other systems are negative.      Objective:   Vitals:    Vitals:    04/18/22 1144   BP: 126/68   Pulse: 100   Resp: 18   Temp: 36.3 C (97.4 F)   SpO2: 100%   Weight: 106 kg (234 lb 1.6 oz)    Height: 1.651 m (_0 )   BMI: 39.04          Body mass index is 38.96 kg/m.      Constitutional: Alert, well developed, well nourished  HEENT:  Head: NC/AT    Eyes: Sclera anicteric, conjunctiva not injected    Ears: EAC normal, bilateral TMs clear    Nose: No discharge    Throat: MMM, posterior pharynx without erythema or exudate  Neck:   Supple with normal ROM, no cervical LAD, no thyromegaly, no JVD, no carotid bruits  Cardiovascular: RRR, normal S1/S2, no murmurs/rubs/gallops  Pulmonary:  CTAB, equal air entry, nonlabored, no wheezes/crackles/rhonchi  Abdomen:   NABS, NT/ND, soft, no HSM, no masses  Musculoskeletal:  No deformity, no injury, no edema  Neurological:   Alert, oriented x 3, no abnormal tone  Skin:     Warm, pink, dry, no rashes, no jaundice, no pallor, no cyanosis  Psychiatric:  Normal mood, affect, behavior, judgment, and thought content        Assessment & Plan:     1. Essential hypertension  Well controlled with current regimen which we will continue.        2. Type 2 diabetes mellitus without complication, without long-term current use of insulin (CMS HCC)  Continue current meds, including ACE-I and statin.  Urine microalbumin UTD.  Annual eye exam UTD.  Podiatry not indicated.  Dietary compliance stressed.  Recommend checking FS QD - BID.      3. Trigeminal neuralgia of right side of face  Medrol dose pack  - Refer to External Provider    4. Hyperlipidemia, unspecified hyperlipidemia type  Continue statin.  Lipid panel at goal within past 6 months.                      Health Maintenance   Topic Date  Due    Hepatitis B Vaccine (1 of 3 - 3-dose series) Never done    Covid-19 Vaccine (1) Never done    Pneumococcal Vaccine, Age 41-64 (1 - PCV) Never done    HIV Screening  Never done    Adult Tdap-Td (1 - Tdap) Never done    Pap smear  Never done    Colonoscopy  Never done    Influenza Vaccine (1) 01/06/2022    Depression Screening  09/28/2022    Diabetic A1C  10/13/2022    Diabetic Retinal  Exam  01/18/2023    Diabetic Kidney Health eGFR  04/13/2023    Diabetic Kidney Health Microalb/Cr Ratio  04/13/2023    Meningococcal Vaccine  Aged Out       No follow-ups on file.    The patient has been educated and verbalized understanding regarding the services provided during this visit.      Philmore Pali, DO

## 2022-04-20 ENCOUNTER — Other Ambulatory Visit (INDEPENDENT_AMBULATORY_CARE_PROVIDER_SITE_OTHER): Payer: Self-pay | Admitting: Family Medicine

## 2022-05-29 IMAGING — MR MRA HEAD WITHOUT CONTRAST
1 series · 19 of 48 positions shown · non-contrast
Comparison: none

﻿

Pertinent Hx:    Right facial pain.
TECHNIQUE: 3D time of flight images are performed through the intracranial circulation in the collapsed axial and true coronal plane.

[Series 3: tof_3d_multi-slab · axial · 0.6mm · 0.35mm/px · z∈[-40,+75]mm · 19 of 186 slices shown]
[im 1/186]
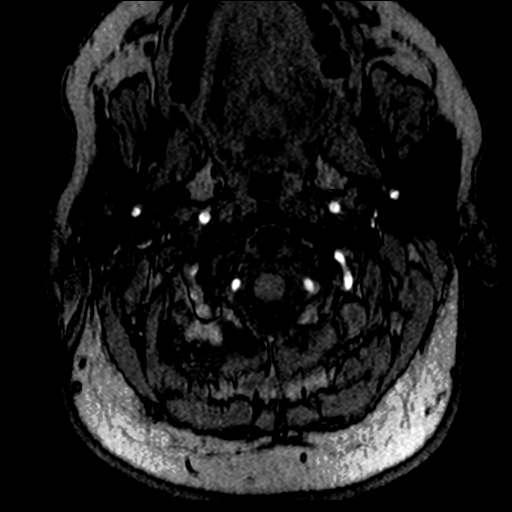
[im 4/186]
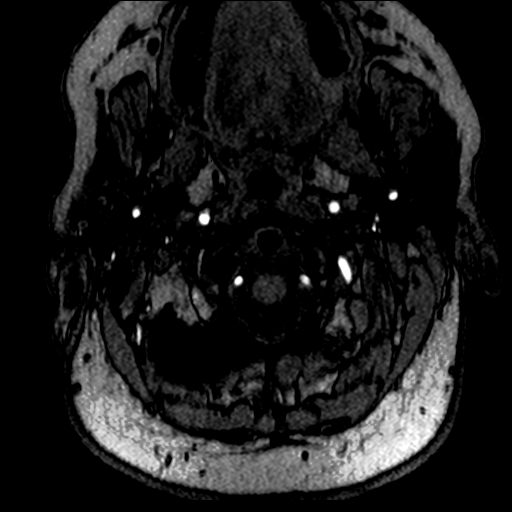
[im 8/186]
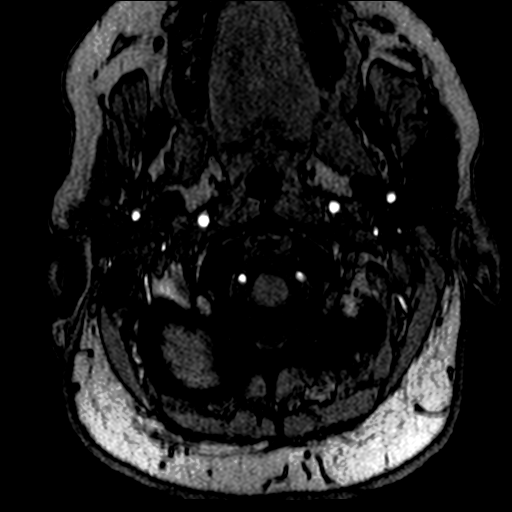
[im 12/186]
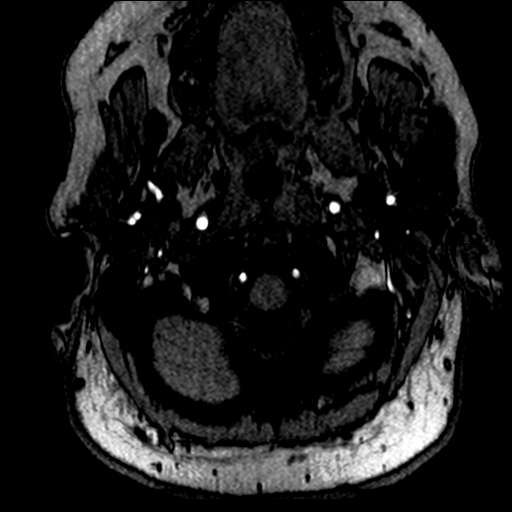
[im 16/186]
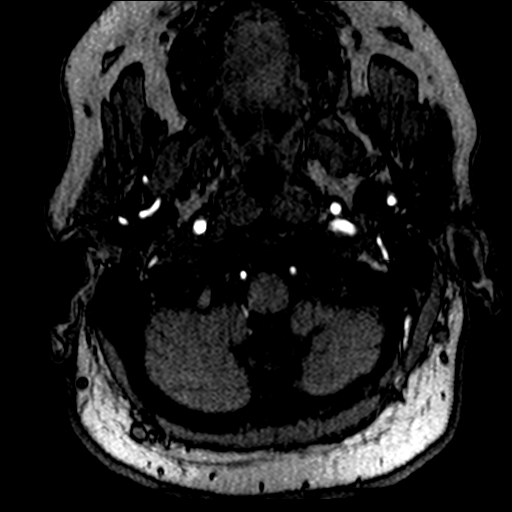
[im 20/186]
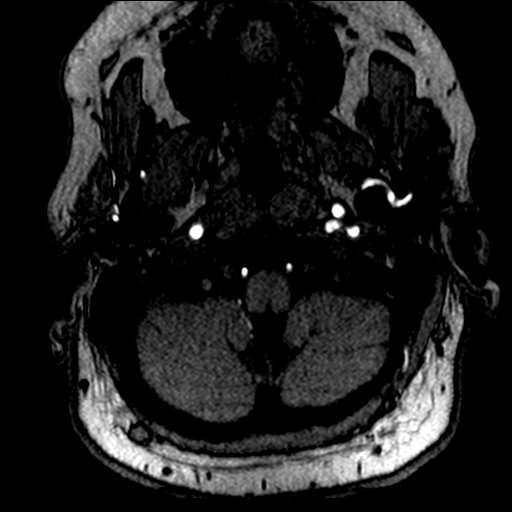
[im 24/186]
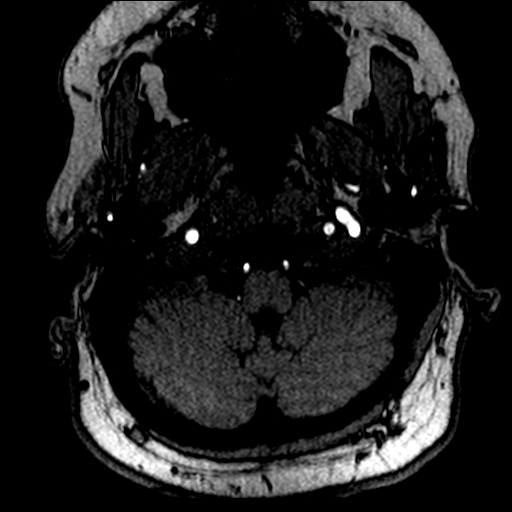
[im 28/186]
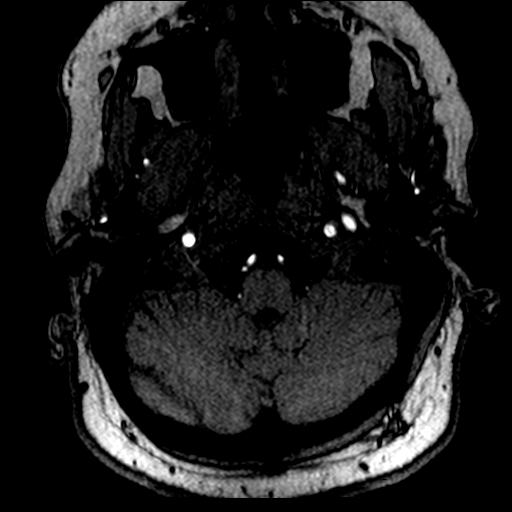
[im 32/186]
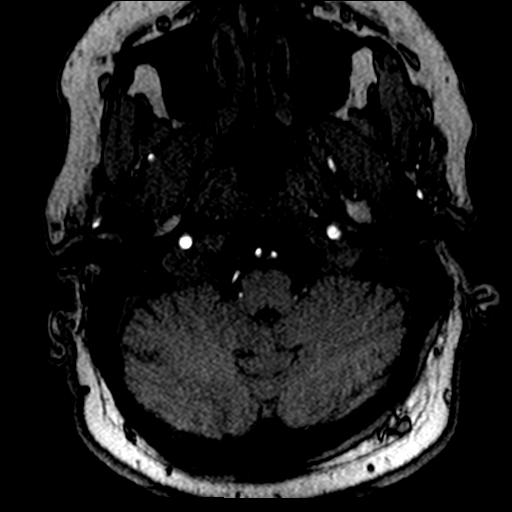
[im 36/186]
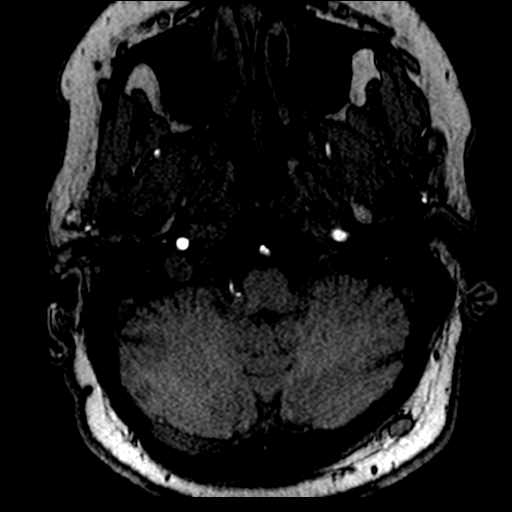
[im 40/186]
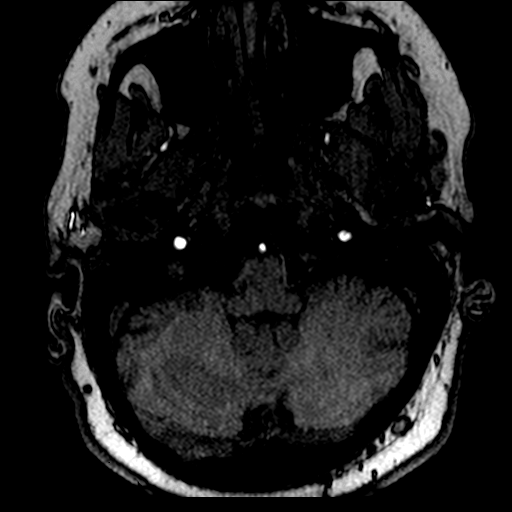
[im 60/186]
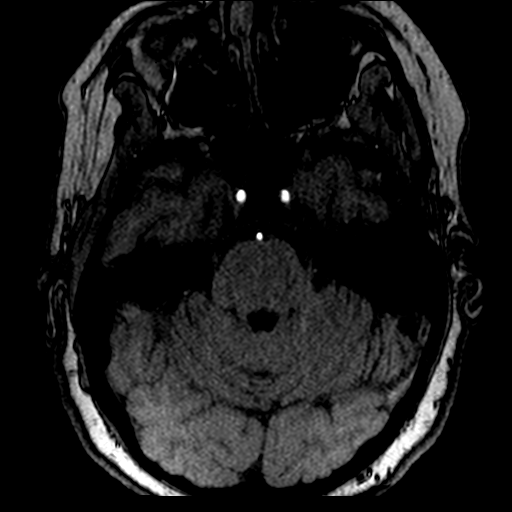
[im 83/186]
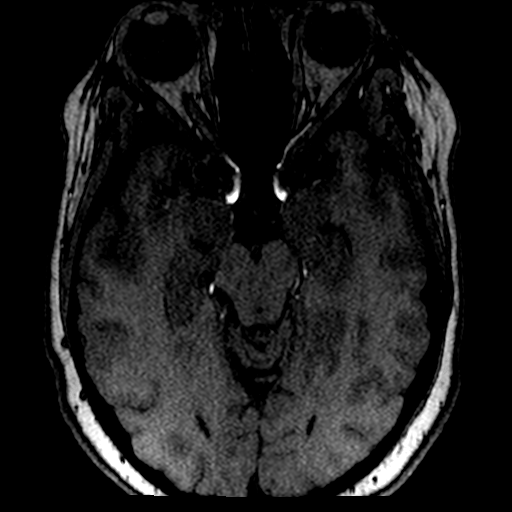
[im 95/186]
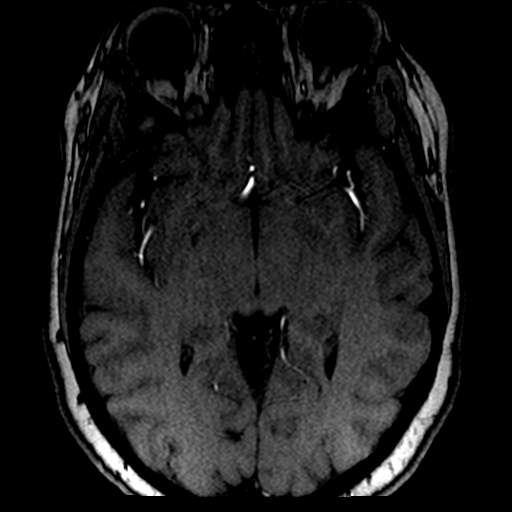
[im 107/186]
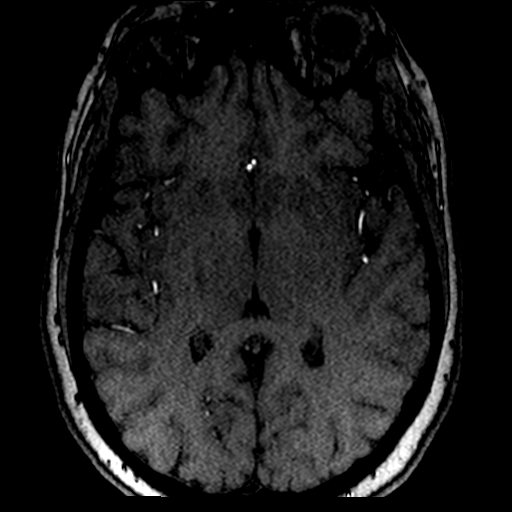
[im 130/186]
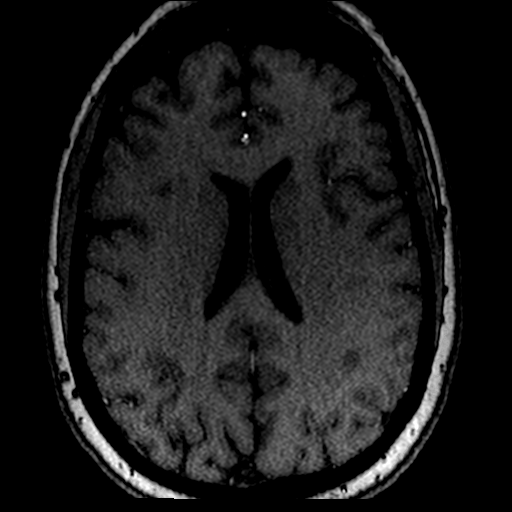
[im 154/186]
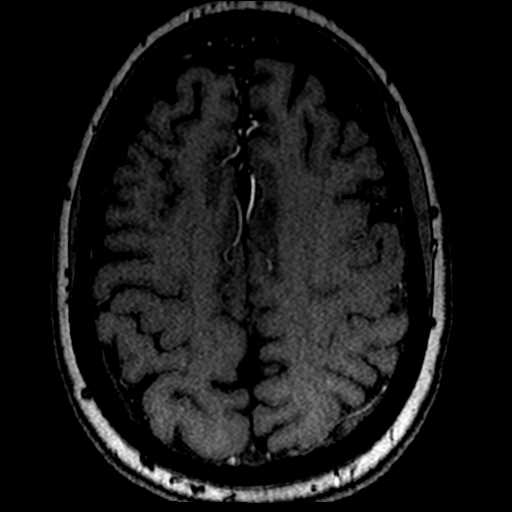
[im 158/186]
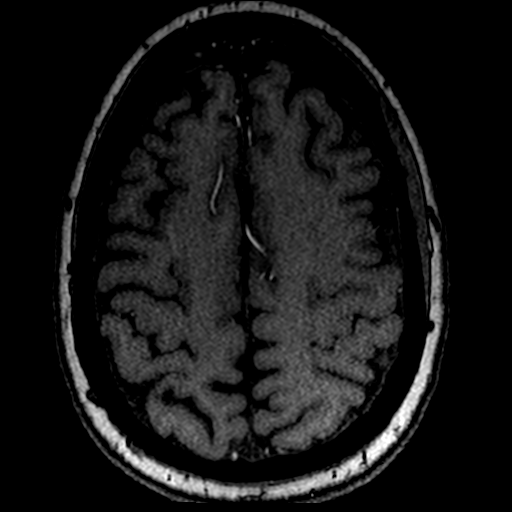
[im 178/186]
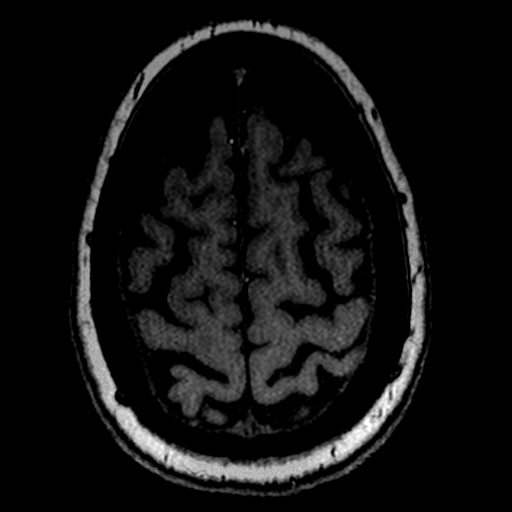

[19 of 48 positions shown; findings below may reference images not displayed]

FINDINGS: Both internal carotid arteries for the most part are normal.  However, there are degenerative changes within both middle cerebral arteries and both anterior cerebral arteries that arise from the intracranial carotid arteries.  Image 2 series 101.  These degenerative changes have not produced one area of focal stenosis but both middle cerebral and anterior cerebral arteries appear to be degenerated and irregular in size.  In addition, there appear to be degenerative changes in both vertebral arteries and in the basilar artery which although there is no focal stenosis, there does appear to be considerable degenerative change in that vessel as well.
IMPRESSION: This is an abnormal study which appears to show very diffuse degenerative changes in the vertebrobasilar system and in the intracranial carotid system, specifically the middle cerebral arteries and anterior cerebral arteries bilaterally.  Further imaging utilizing CTA of the intracranial circulation, that is the intracranial circulation studied by CT scan, is strongly recommended.  This often shows a clearer picture.

## 2022-05-29 IMAGING — MR MRI BRAIN WITHOUT AND WITH CONTRAST
12 of 14 series · 42 of 48 positions shown · IV contrast (prohance)
Comparison: none

﻿

Pertinent Hx:    Right facial pain.
TECHNIQUE: T1, T2, and FLAIR weighted axial images of the brain were performed along with T2 weighted coronal images. Additional diffusion weighted images were performed.  20 mL Prohance is administered and T1 weighted axials, coronals, and sagittals are performed.

[Series 2: T1 · sagittal · 5.0mm · 0.94mm/px · 2 of 24 slices shown (1 of 5)]
[im 1/24]
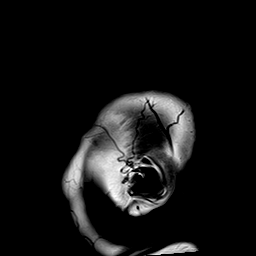
[im 24/24]
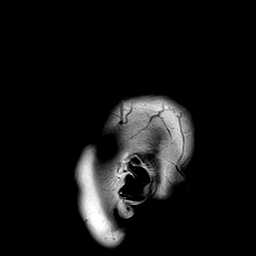

[Series 3: T2 · coronal · 5.0mm · 0.69mm/px · 2 of 28 slices shown (1 of 2)]
[im 1/28]
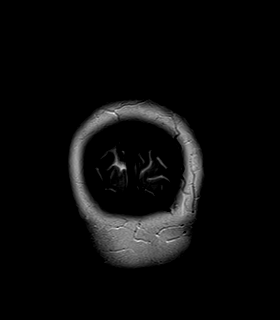
[im 28/28]
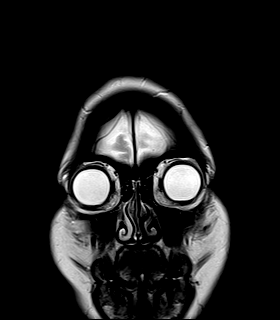

[Series 4: T2 · axial · 5.0mm · 0.69mm/px · z∈[-42,+94]mm · 2 of 24 slices shown (2 of 2)]
[im 1/24]
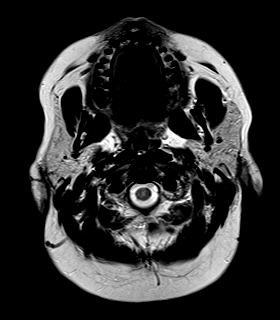
[im 24/24]
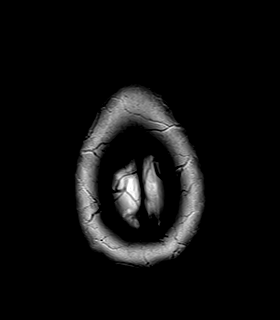

[Series 5: T1 · axial · 5.0mm · 0.69mm/px · z∈[-42,+94]mm · 2 of 24 slices shown (2 of 5)]
[im 1/24]
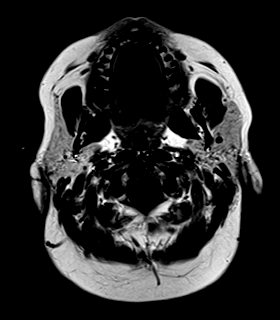
[im 24/24]
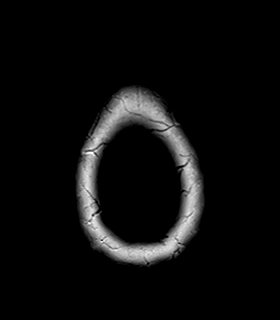

[Series 6: DWI · axial · 5.0mm · 1.72mm/px · z∈[-42,+94]mm · 2 of 23 slices shown]
[im 1/23]
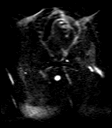
[im 23/23]
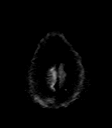

[Series 7: ax dwi_adc · axial · 5.0mm · 1.72mm/px · z∈[-42,+94]mm · 2 of 24 slices shown]
[im 1/24]
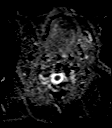
[im 24/24]
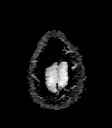

[Series 8: FLAIR · axial · 5.0mm · 0.43mm/px · z∈[-42,+94]mm · 2 of 24 slices shown]
[im 1/24]
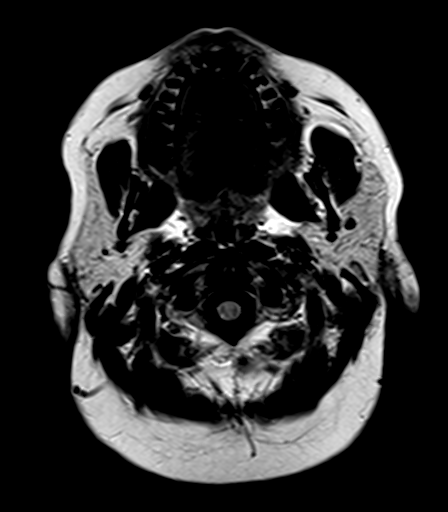
[im 24/24]
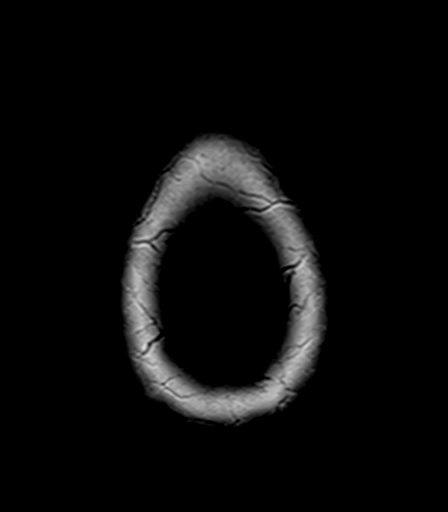

[Series 9: GRE · axial · 5.0mm · 0.43mm/px · z∈[-41,+95]mm · 2 of 24 slices shown]
[im 1/24]
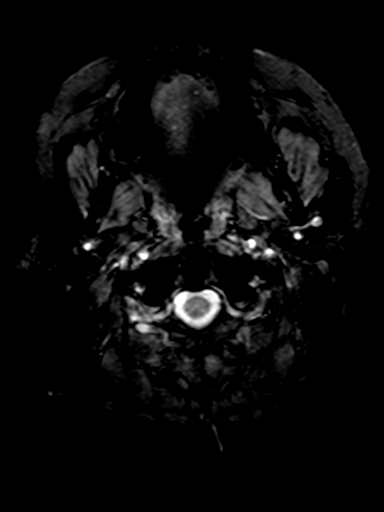
[im 24/24]
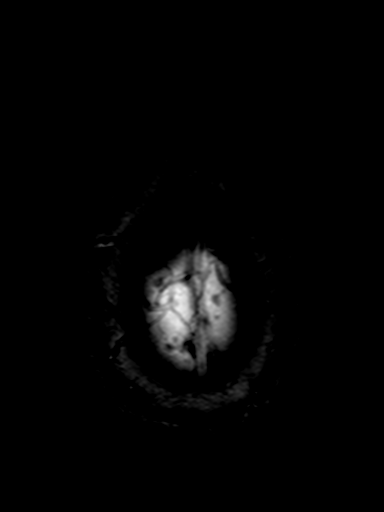

[Series 12: T1 · axial · 1.5mm · 0.69mm/px · z∈[-38,+91]mm · 8 of 88 slices shown (3 of 5)]
[im 1/88]
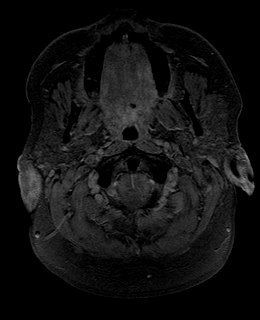
[im 13/88]
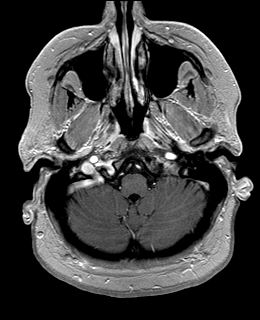
[im 25/88]
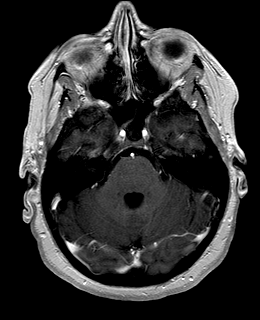
[im 38/88]
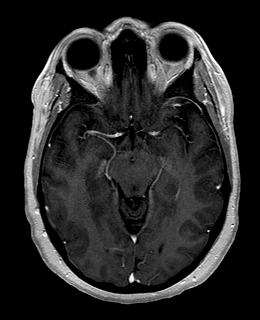
[im 50/88]
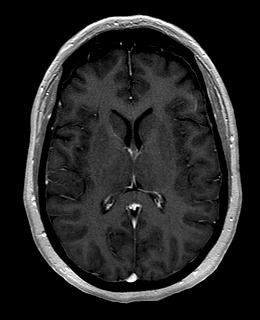
[im 63/88]
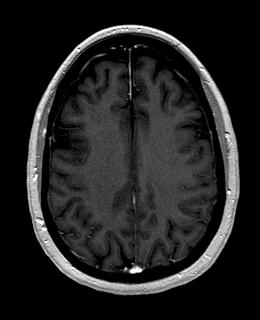
[im 75/88]
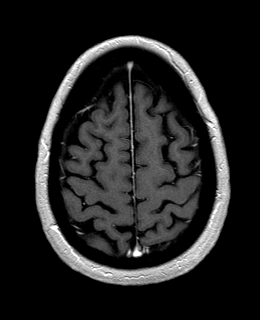
[im 88/88]
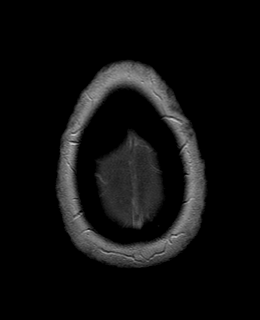

[Series 13: T1 · sagittal · 1.5mm · 0.86mm/px · 7 of 80 slices shown (4 of 5)]
[im 1/80]
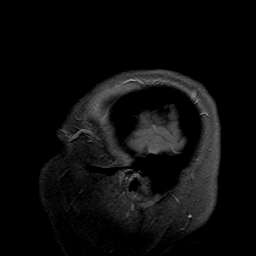
[im 14/80]
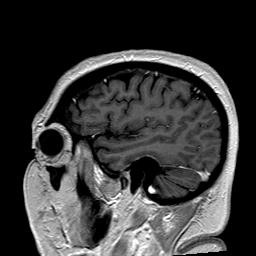
[im 27/80]
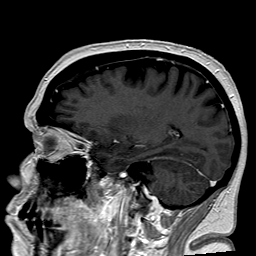
[im 40/80]
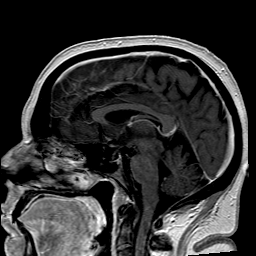
[im 53/80]
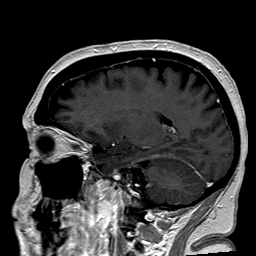
[im 66/80]
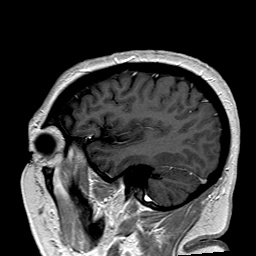
[im 80/80]
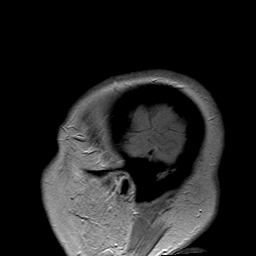

[Series 14: T1 · coronal · 1.5mm · 0.86mm/px · 10 of 112 slices shown (5 of 5)]
[im 1/112]
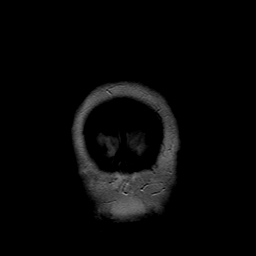
[im 13/112]
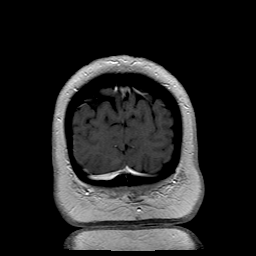
[im 25/112]
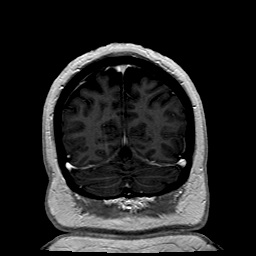
[im 38/112]
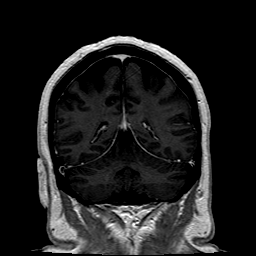
[im 50/112]
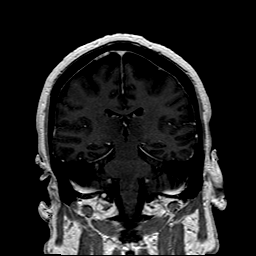
[im 62/112]
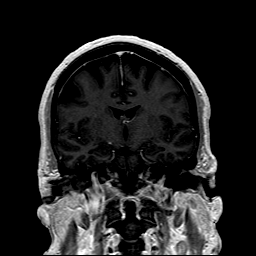
[im 75/112]
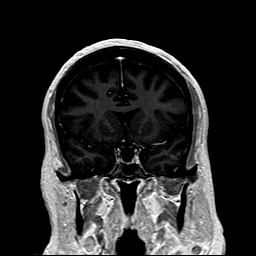
[im 87/112]
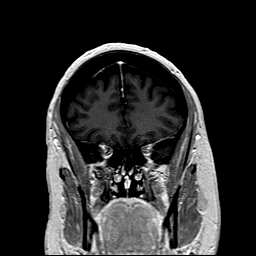
[im 99/112]
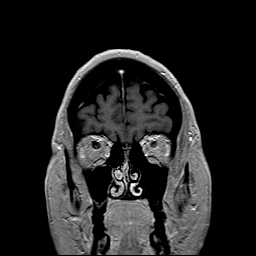
[im 112/112]
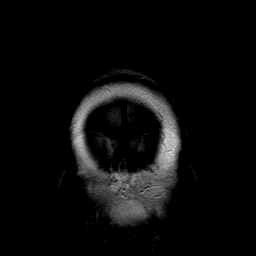

[Series 15: T1 fat-sat · coronal · 2.5mm · 0.31mm/px · 1 of 12 slices shown]
[im 1/12]
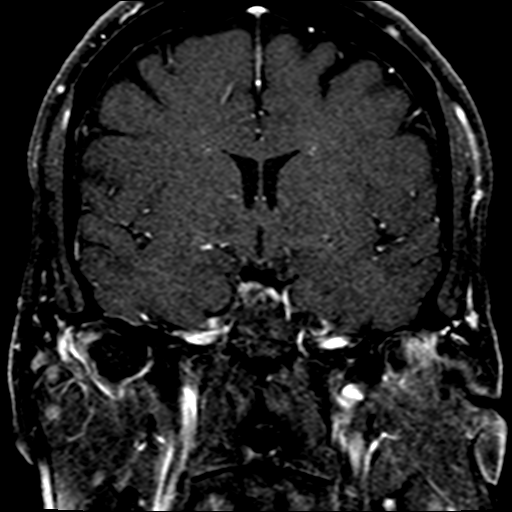

[42 of 48 positions shown; findings below may reference images not displayed]

FINDINGS: The fourth, third and lateral ventricles are normal in size and location.  There is a slight increase in the degree of cerebral atrophy but there is no evidence of ventriculomegaly.  

In sections through the brainstem, there is a strong suspicion of some asymmetry in the diameter of the fifth nerve with the right fifth nerve being smaller than the left.  See axial view 21 of series 10 which has both fifth nerves showing the right smaller in diameter than the left.  See also image 20 of series 11.  No obvious infarct is present in the brainstem or in the cerebral substance.  No abnormal enhancement is identified on this study.
IMPRESSION: The study suggests some asymmetry in the diameter of the trigeminal nerves with the right being smaller than the left.  This is not necessarily a typical finding in patients with clinical trigeminal neuralgia.  There is an inordinate degree of cerebral atrophy on this study as well.  There is no evidence of ventriculomegaly noted.

## 2022-06-01 ENCOUNTER — Ambulatory Visit (INDEPENDENT_AMBULATORY_CARE_PROVIDER_SITE_OTHER): Payer: Self-pay | Admitting: Student in an Organized Health Care Education/Training Program

## 2022-09-06 ENCOUNTER — Other Ambulatory Visit (HOSPITAL_BASED_OUTPATIENT_CLINIC_OR_DEPARTMENT_OTHER): Payer: Self-pay | Admitting: Orthopaedic Surgery

## 2022-09-06 ENCOUNTER — Other Ambulatory Visit: Payer: Self-pay

## 2022-09-06 ENCOUNTER — Inpatient Hospital Stay
Admission: RE | Admit: 2022-09-06 | Discharge: 2022-09-06 | Disposition: A | Discharge status: Home / Self Care | Payer: BC Managed Care – PPO | Source: Ambulatory Visit | Attending: Orthopaedic Surgery | Admitting: Orthopaedic Surgery

## 2022-09-06 DIAGNOSIS — R52 Pain, unspecified: Secondary | ICD-10-CM

## 2022-10-07 LAB — MICROALBUMIN/CREATININE RATIO, URINE, RANDOM
CREATININE, UR RAND: 0.4
MICROALBUMIN RANDOM URINE: 81
MICROALBUMIN/CREAT RATIO: 5

## 2022-10-10 ENCOUNTER — Other Ambulatory Visit (INDEPENDENT_AMBULATORY_CARE_PROVIDER_SITE_OTHER): Payer: Self-pay

## 2022-10-10 DIAGNOSIS — E785 Hyperlipidemia, unspecified: Secondary | ICD-10-CM

## 2022-10-10 DIAGNOSIS — I10 Essential (primary) hypertension: Secondary | ICD-10-CM

## 2022-10-10 DIAGNOSIS — E119 Type 2 diabetes mellitus without complications: Secondary | ICD-10-CM

## 2022-10-18 ENCOUNTER — Ambulatory Visit (INDEPENDENT_AMBULATORY_CARE_PROVIDER_SITE_OTHER): Payer: BC Managed Care – PPO | Admitting: Family Medicine

## 2022-10-18 ENCOUNTER — Other Ambulatory Visit: Payer: Self-pay

## 2022-10-18 ENCOUNTER — Encounter (INDEPENDENT_AMBULATORY_CARE_PROVIDER_SITE_OTHER): Payer: Self-pay | Admitting: Family Medicine

## 2022-10-18 ENCOUNTER — Other Ambulatory Visit (INDEPENDENT_AMBULATORY_CARE_PROVIDER_SITE_OTHER): Payer: Self-pay

## 2022-10-18 VITALS — BP 132/66 | HR 94 | Temp 97.1°F | Resp 18 | Ht 65.0 in | Wt 242.1 lb

## 2022-10-18 DIAGNOSIS — E119 Type 2 diabetes mellitus without complications: Secondary | ICD-10-CM

## 2022-10-18 DIAGNOSIS — D72829 Elevated white blood cell count, unspecified: Secondary | ICD-10-CM

## 2022-10-18 DIAGNOSIS — I10 Essential (primary) hypertension: Secondary | ICD-10-CM

## 2022-10-18 DIAGNOSIS — E039 Hypothyroidism, unspecified: Secondary | ICD-10-CM

## 2022-10-18 DIAGNOSIS — Z7984 Long term (current) use of oral hypoglycemic drugs: Secondary | ICD-10-CM

## 2022-10-18 NOTE — Nursing Note (Signed)
10/18/22 1000   Recent Weight Change   Have you had a recent unexplained weight loss or gain? N   Domestic Violence   Because we are aware of abuse and domestic violence today, we ask all patients: Are you being hurt, hit, or frightened by anyone at your home or in your life?  N   Basic Needs   Do you have any basic needs within your home that are not being met? (such as Food, Shelter, Civil Service fast streamer, Tranportation, paying for bills and/or medications) N

## 2022-10-18 NOTE — Progress Notes (Signed)
OUTPATIENT PROGRESS NOTE    Subjective:   Patient ID:  Dawn Michael is a pleasant 47 y.o. female.    Chief Complaint: Sleep Apnea, Essential Hypertension, Diabetes, and Leukocytosis      History of Present Illness:  HTN: HAs No    Dizziness No   Feeling like BP is too high or low No   Compliant with taking meds Yes   Home BP:  did not bring log    BP Readings from Last 3 Encounters:   10/18/22 132/66   04/18/22 126/68   03/29/22 120/68     DM: Fasting FS range 129   Nonfasting FS range    Hypoglycemia episodes  No   Compliant with diabetic diet Yes   Sores on feet No    HEMOGLOBIN A1C   Date Value Ref Range Status   04/13/2022 6.2  Final     Results in Last 18 Months   Lab Test 10/07/22  0000   MICALBRNUR 81   MICALBCRERAT 5         Wt Readings from Last 3 Encounters:   10/18/22 110 kg (242 lb 1.6 oz)   04/18/22 106 kg (234 lb 1.6 oz)   03/29/22 109 kg (240 lb)           Has trigeminal neuralgia under care of neurologist  Obesity on ozempic    The history is provided by the patient.      Allergies:     Allergies   Allergen Reactions    Ceclor [Cefaclor]     Trimox [Amoxicillin]     Wellbutrin [Bupropion Hcl]          Medications:     Current Outpatient Medications   Medication Instructions    atorvastatin (LIPITOR) 10 mg, Oral    Blood Sugar Diagnostic (ONETOUCH VERIO TEST STRIPS) Strip 1 Strip, Does not apply, 2 TIMES DAILY    DULoxetine (CYMBALTA DR) 60 mg, Oral, 2 TIMES DAILY    folic acid (FOLVITE) 1 mg Oral Tablet     glimepiride (AMARYL) 4 mg Oral Tablet     insulin glargine 100 unit/mL Subcutaneous Insulin Pen Use to inject 28 units at night    Lancets Misc Accu-chek fast clix lancets, test twice daily    losartan-hydrochlorothiazide (HYZAAR) 50-12.5 mg Oral Tablet TAKE 1 TABLET DAILY    metFORMIN (GLUCOPHAGE XR) 500 mg Oral Tablet Sustained Release 24 hr     metoclopramide HCl (REGLAN) 10 mg Oral Tablet Oral    ONETOUCH DELICA PLUS LANCET 33 gauge Does not apply Misc No dose, route, or frequency recorded.     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 She has never used smokeless tobacco. She reports current alcohol use. She reports being sexually active and has had partner(s) who are female. She reports that she does not use drugs.          Review of Systems: Other than ROS in HPI, all other systems are negative.      Objective:   Vitals:    Vitals:    10/18/22 1051   BP: 132/66   Pulse: 94   Resp: 18   Temp: 36.2 C (97.1 F)   SpO2: 100%   Weight: 110 kg (242 lb 1.6 oz)   Height: 1.651 m (5\' 5" )   BMI: 40.37          Body mass index is 40.29 kg/m.      Constitutional: Alert, well developed, well nourished  HEENT:  Head: NC/AT    Eyes: Sclera anicteric, conjunctiva not injected    Ears: EAC normal, bilateral TMs clear    Nose: No discharge    Throat: MMM, posterior pharynx without erythema or exudate  Neck:   Supple with normal ROM, no cervical LAD, no  thyromegaly, no JVD, no carotid bruits  Cardiovascular: RRR, normal S1/S2, no murmurs/rubs/gallops  Pulmonary:  CTAB, equal air entry, nonlabored, no wheezes/crackles/rhonchi  Abdomen:   NABS, NT/ND, soft, no HSM, no masses  Musculoskeletal:  No deformity, no injury, no edema  Neurological:   Alert, oriented x 3, no abnormal tone  Skin:     Warm, pink, dry, no rashes, no jaundice, no pallor, no cyanosis  Psychiatric:  Normal mood, affect, behavior, judgment, and thought content    Results for orders placed or performed in visit on 10/10/22 (from the past 2016 hour(s))   MICROALBUMIN/CREATININE RATIO, URINE, RANDOM   Result Value Ref Range    MICROALBUMIN RANDOM URINE 81     CREATININE, UR RAND 0.4     MICROALBUMIN/CREAT RATIO 5          Assessment & Plan:     1. Leukocytosis, unspecified type  Full workup by dr Jason Nest    2. Essential hypertension  Well controlled with current regimen which we will continue.        3. Type 2 diabetes mellitus without complication, without long-term current use of insulin (CMS HCC)  Continue current meds, including ACE-I and statin.  Urine microalbumin UTD.  Annual eye exam UTD.  Podiatry not indicated.  Dietary compliance stressed.  Recommend checking FS QD - BID.      4. Acquired hypothyroidism  Stable on meds      Depression screening is negative. PHQ 2 Total: 0               Health Maintenance   Topic Date Due    NonMedicare  Preventative Exam  Never done    Hepatitis C screening  Never done    Pneumococcal Vaccine, Age 69-64 (1 of 2 - PCV) Never done    HIV Screening  Never done    Adult Tdap-Td (1 - Tdap) Never done    Hepatitis B Vaccine (1 of 3 - 19+ 3-dose series) Never done    Pap smear/HPV  Never done    Colonoscopy  Never done    Covid-19 Vaccine (3 - 2023-24 season) 01/06/2022    Influenza Vaccine (Season Ended) 01/07/2023    Diabetic Retinal Exam  01/18/2023    Diabetic A1C  03/13/2023    Diabetic Kidney Health eGFR  09/10/2023    Diabetic Kidney Health Microalb/Cr Ratio   10/07/2023    Depression Screening  10/18/2023    Mammography  09/26/2024    Meningococcal Vaccine  Aged Out       No follow-ups on file.        Bennie Hind, DO

## 2022-10-18 NOTE — Nursing Note (Signed)
10/18/22 1000   Depression Screen   Little interest or pleasure in doing things. 0   Feeling down, depressed, or hopeless 0   PHQ 2 Total 0

## 2022-11-08 ENCOUNTER — Other Ambulatory Visit: Payer: Self-pay

## 2022-12-22 ENCOUNTER — Ambulatory Visit (INDEPENDENT_AMBULATORY_CARE_PROVIDER_SITE_OTHER): Payer: BC Managed Care – PPO | Admitting: Otolaryngology

## 2022-12-26 ENCOUNTER — Ambulatory Visit (INDEPENDENT_AMBULATORY_CARE_PROVIDER_SITE_OTHER): Payer: Self-pay | Admitting: OTOLARYNGOLOGY

## 2023-04-03 ENCOUNTER — Encounter (INDEPENDENT_AMBULATORY_CARE_PROVIDER_SITE_OTHER): Payer: Self-pay | Admitting: Family Medicine

## 2023-04-03 ENCOUNTER — Ambulatory Visit (HOSPITAL_COMMUNITY): Payer: Self-pay | Admitting: Student in an Organized Health Care Education/Training Program

## 2023-04-19 ENCOUNTER — Ambulatory Visit (INDEPENDENT_AMBULATORY_CARE_PROVIDER_SITE_OTHER): Payer: Self-pay | Admitting: Family Medicine

## 2023-04-26 ENCOUNTER — Ambulatory Visit (HOSPITAL_COMMUNITY): Payer: Self-pay | Admitting: Student in an Organized Health Care Education/Training Program

## 2023-05-30 ENCOUNTER — Ambulatory Visit (INDEPENDENT_AMBULATORY_CARE_PROVIDER_SITE_OTHER): Payer: BC Managed Care – PPO | Admitting: Family Medicine

## 2023-06-15 LAB — COMPREHENSIVE METABOLIC PNL, FASTING
ALBUMIN: 2.1
ALKALINE PHOSPHATASE: 90
ALT (SGPT): 12
AST (SGOT): 10
BILIRUBIN, TOTAL: 0.4
BUN: 10
CALCIUM: 9.1
CARBON DIOXIDE: 29
CHLORIDE: 92
CREATININE: 0.65
ESTIMATED GLOMERULAR FILTRATION RATE: 109
GLUCOSE, FASTING: 114
POTASSIUM: 4
SODIUM: 4
TOTAL PROTEIN: 6.8

## 2023-06-15 LAB — LIPID PANEL
CHOLESTEROL: 162
HDL-CHOLESTEROL: 46
LDL (CALCULATED): 91
NON - HDL (CALCULATED): 116
TRIGLYCERIDES: 157
VLDL (CALCULATED): 3.5

## 2023-06-15 LAB — HGA1C (HEMOGLOBIN A1C WITH EST AVG GLUCOSE): HEMOGLOBIN A1C: 5.8

## 2023-06-18 ENCOUNTER — Encounter (INDEPENDENT_AMBULATORY_CARE_PROVIDER_SITE_OTHER): Payer: Self-pay | Admitting: Family Medicine

## 2023-06-18 DIAGNOSIS — I10 Essential (primary) hypertension: Secondary | ICD-10-CM

## 2023-06-18 DIAGNOSIS — E119 Type 2 diabetes mellitus without complications: Secondary | ICD-10-CM

## 2023-06-18 DIAGNOSIS — E785 Hyperlipidemia, unspecified: Secondary | ICD-10-CM

## 2023-06-20 ENCOUNTER — Other Ambulatory Visit: Payer: Self-pay

## 2023-06-20 ENCOUNTER — Encounter (INDEPENDENT_AMBULATORY_CARE_PROVIDER_SITE_OTHER): Payer: Self-pay | Admitting: Medical

## 2023-06-20 ENCOUNTER — Ambulatory Visit (INDEPENDENT_AMBULATORY_CARE_PROVIDER_SITE_OTHER): Payer: Self-pay | Admitting: Medical

## 2023-06-20 VITALS — BP 124/80 | HR 86 | Ht 65.0 in | Wt 251.3 lb

## 2023-06-20 DIAGNOSIS — M542 Cervicalgia: Secondary | ICD-10-CM

## 2023-06-20 DIAGNOSIS — R079 Chest pain, unspecified: Secondary | ICD-10-CM

## 2023-06-20 DIAGNOSIS — D72829 Elevated white blood cell count, unspecified: Secondary | ICD-10-CM

## 2023-06-20 MED ORDER — TRIAMCINOLONE ACETONIDE 0.1 % TOPICAL CREAM
TOPICAL_CREAM | Freq: Two times a day (BID) | CUTANEOUS | 0 refills | Status: AC
Start: 2023-06-20 — End: 2023-07-04

## 2023-06-20 NOTE — Nursing Note (Signed)
Patient is here for ER follow up. Patient states she was having off and on chest pain and pain in left side of neck.  Patient would like cardiology referral.

## 2023-06-20 NOTE — Nursing Note (Signed)
06/20/23 0945   Housing Stability   What is your housing situation? Has Housing   Are you worried about losing your housing? No   Health Education and Literacy   How often do you have a problem understanding what is told to you about your medical condition?  Never   Employment   What is your current work situation? Other unemp.   Financial   In the past year, have you or any family members you live with been unable to get any of the following when it was really needed?  No   Transportation   Has lack of transportation kept you from medical appointments, meetings, work, or from getting things needed for daily living?  No   Social Connections   How often do you see or talk to people that you care about and feel close to? (For example: talking to friends on the phone, visiting friends or family, going to church or club meetings) >=5x a wk   Intimate Partner Violence   In the past year, have you been afraid of your partner or ex-partner? No   Do you feel physically safe and emotionally safe where you currently live? Yes   Help Needed   Would you like help with any of these needs? No   Are any of these needs urgent? No

## 2023-06-20 NOTE — Progress Notes (Signed)
[facility]  [address]  [place] [address]        Name:  Dawn Michael MRN: [id number]   Date:    06/20/2023 Age:  48 y.o.         Chief Complaint: ED Follow-up      Subjective:   Dawn Michael is a 48 y.o. female who presents to the office for ER follow-up.  She was seen Monday night in the ER, [facility], chest pain radiating into the left side of the neck.  Nonexertional.  No other symptoms.  Reviewed ER records, which reveal unremarkable workup, aside from sodium of 131 (which patient reports is chronic due to carbamazepine) and she does endorse that she was increasing sodium in her diet.  Blood pressure was elevated upon ER evaluation.  White blood cell count was mildly elevated at 13,000 thousand, she follows with Hematology for this.  Also follows with Neurology for trigeminal neuralgia.    She was told to take NSAIDs, as cardiac workup was within normal limits and musculoskeletal etiology was suspected.  She would like referral to Cardiology, due to chronic history of diabetes, hypertension.    Review of Systems   Constitutional:  Negative for chills, fever and malaise/fatigue.   HENT:  Negative for congestion, ear pain, sinus pain and sore throat.    Respiratory:  Negative for cough and shortness of breath.    Cardiovascular:  Positive for chest pain (None current). Negative for leg swelling.   Gastrointestinal:  Negative for abdominal pain, constipation, diarrhea, nausea and vomiting.   Musculoskeletal:  Positive for myalgias and neck pain. Negative for joint pain.   Neurological:  Negative for dizziness, weakness and headaches.   Psychiatric/Behavioral: Negative.          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 TAKE WITH 200MG  TABLET.  carBAMazepine (TEGRETOL  XR) 200 mg Oral Tablet Sustained Release 12 hr,   DULoxetine (CYMBALTA DR) 60 mg Oral Capsule, Delayed Release(E.C.), Take 1 Capsule (60 mg total) by mouth Twice daily  folic acid (FOLVITE) 1 mg Oral Tablet,   glimepiride (AMARYL) 4 mg Oral Tablet,   insulin glargine 100 unit/mL Subcutaneous Insulin Pen, Use to inject 28 units at night (Patient taking differently: 32 units at night)  Lancets Misc, Accu-chek fast clix lancets, test twice daily  losartan-hydrochlorothiazide (HYZAAR) 50-12.5 mg Oral Tablet, TAKE 1 TABLET DAILY  metFORMIN (GLUCOPHAGE XR) 500 mg Oral Tablet Sustained Release 24 hr,   metoclopramide HCl (REGLAN) 10 mg Oral Tablet, Take by mouth  ONETOUCH DELICA PLUS LANCET 33 gauge Does not apply Misc,   pantoprazole (PROTONIX) 40 mg Oral Tablet, Delayed Release (E.C.), Take 1 Tablet (40 mg total) by mouth Once a day  semaglutide (OZEMPIC) 0.25  mg or 0.5 mg(2 mg/1.5 mL) Subcutaneous Pen Injector, 0.5 mg by Subcutaneous route Every 7 days for 90 days  SYNTHROID 50 mcg Oral Tablet, Take 1 Tablet (50 mcg total) by mouth Every morning  VRAYLAR 1.5 mg Oral Capsule, take 1 capsule by mouth daily    No facility-administered medications prior to visit.      Allergies   Allergen Reactions    Ceclor [Cefaclor]     Trimox [Amoxicillin]     Wellbutrin [Bupropion Hcl]         Past Surgical History:   Procedure Laterality Date    ANKLE SURGERY      HX CERVICAL POLYPECTOMY      HX CHOLECYSTECTOMY          Objective:     BP 124/80   Pulse 86   Ht 1.651 m (5\' 5" )   Wt 114 kg (251 lb 4.8 oz)   SpO2 95%   BMI 41.82 kg/m          Physical Exam  Constitutional:       General: She is not in acute distress.      Appearance: Normal appearance. She is not ill-appearing, toxic-appearing or diaphoretic.   HENT:      Head: Normocephalic and atraumatic.      Right Ear: Tympanic membrane, ear canal and external ear normal. There is no impacted cerumen.      Left Ear: Tympanic membrane, ear canal and external ear normal. There is no impacted cerumen.      Nose: Nose normal.   Eyes:      General: No scleral icterus.     Extraocular Movements: Extraocular movements intact.      Conjunctiva/sclera: Conjunctivae normal.      Pupils: Pupils are equal, round, and reactive to light.   Neck:      Vascular: No carotid bruit.   Cardiovascular:      Rate and Rhythm: Normal rate and regular rhythm.      Heart sounds: Normal heart sounds. No murmur heard.  Pulmonary:      Effort: Pulmonary effort is normal. No respiratory distress.      Breath sounds: Normal breath sounds. No wheezing, rhonchi or rales.   Musculoskeletal:         General: Normal range of motion.      Cervical back: Normal range of motion and neck supple. No tenderness.   Lymphadenopathy:      Cervical: No cervical adenopathy.   Skin:     General: Skin is warm and dry.      Coloration: Skin is not jaundiced or pale.      Findings: No rash.   Neurological:      General: No focal deficit present.      Mental Status: She is alert and oriented to person, place, and time.      Sensory: No sensory deficit.      Motor: No weakness.      Gait: Gait normal.   Psychiatric:         Mood and Affect: Mood normal.         Behavior: Behavior normal.         Thought Content: Thought content normal.         Judgment: Judgment normal.            Assessment/Plan:     1. Chest pain, unspecified type  Reviewed ER documentation, cardiac workup was within normal limits.  Given history of diabetes,  hypertension, patient prefers to have cardiology referral, possible workup with stress test.  Will place referral today.  With any change in symptoms, notify office.  - Refer to [facility]; Future    2. Neck pain (Primary)  See above.  May consider x-ray cervical/thoracic spine with complete negative cardiac workup    3. Leukocytosis, unspecified type  Chronic, follows with Hematology.  Continue to monitor.    There are no diagnoses linked to this encounter.  Orders Placed This Encounter    Refer to [facility]    triamcinolone acetonide 0.1 % Cream       Follow Up:   Return for keep scheduled follow up.    The supervising/collaborating physician on site for this visit was Dr. [place].        Kendal Hymen, PA-C

## 2023-07-06 ENCOUNTER — Encounter (INDEPENDENT_AMBULATORY_CARE_PROVIDER_SITE_OTHER): Payer: Self-pay | Admitting: Family Medicine

## 2023-07-06 ENCOUNTER — Other Ambulatory Visit: Payer: Self-pay

## 2023-07-06 ENCOUNTER — Ambulatory Visit (INDEPENDENT_AMBULATORY_CARE_PROVIDER_SITE_OTHER): Payer: BC Managed Care – PPO | Admitting: Family Medicine

## 2023-07-06 VITALS — BP 118/60 | HR 68 | Temp 97.7°F | Resp 18 | Ht 62.0 in | Wt 253.8 lb

## 2023-07-06 DIAGNOSIS — G500 Trigeminal neuralgia: Secondary | ICD-10-CM

## 2023-07-06 DIAGNOSIS — Z0000 Encounter for general adult medical examination without abnormal findings: Secondary | ICD-10-CM

## 2023-07-06 DIAGNOSIS — G932 Benign intracranial hypertension: Secondary | ICD-10-CM | POA: Insufficient documentation

## 2023-07-06 DIAGNOSIS — I10 Essential (primary) hypertension: Secondary | ICD-10-CM

## 2023-07-06 DIAGNOSIS — E119 Type 2 diabetes mellitus without complications: Secondary | ICD-10-CM

## 2023-07-06 DIAGNOSIS — D68 Von Willebrand disease, unspecified (CMS HCC): Secondary | ICD-10-CM | POA: Insufficient documentation

## 2023-07-06 DIAGNOSIS — Z7984 Long term (current) use of oral hypoglycemic drugs: Secondary | ICD-10-CM

## 2023-07-06 DIAGNOSIS — E871 Hypo-osmolality and hyponatremia: Secondary | ICD-10-CM

## 2023-07-06 DIAGNOSIS — K3184 Gastroparesis: Secondary | ICD-10-CM

## 2023-07-06 DIAGNOSIS — E039 Hypothyroidism, unspecified: Secondary | ICD-10-CM

## 2023-07-06 DIAGNOSIS — E785 Hyperlipidemia, unspecified: Secondary | ICD-10-CM

## 2023-07-06 NOTE — Progress Notes (Signed)
 OUTPATIENT PROGRESS NOTE    Subjective:   Patient ID:  Dawn Michael is a pleasant 48 y.o. female.    Chief Complaint: Well Check Adult, Essential Hypertension, Sleep Apnea, and Diabetes      History of Present Illness:  Pt presents for a well adult exam.  The Comprehensive Health Assessment was completed by the patient and reviewed with them.    Diet:  well balanced  Activity level: active lifestyle  Sleep: Adequate sleep  Compliant with taking medications: Yes    Patient Active Problem List   Diagnosis    Headaches    Essential hypertension    Acquired hypothyroidism    Gastroesophageal reflux disease with esophagitis    Type 2 diabetes mellitus without complication, without long-term current use of insulin (CMS HCC)    Abdominal bloating    Diabetic gastroparesis (CMS HCC)  (CMS HCC)    Gastro-esophageal reflux disease without esophagitis    Gluten-sensitive enteropathy    Irritable bowel syndrome without diarrhea    Polycystic ovarian syndrome    OSA on CPAP    Mixed hyperlipidemia    Pseudotumor cerebri    Von Willebrand disease (CMS HCC)       Acute issues:  no complaints       The history is provided by the patient.      Allergies:     Allergies   Allergen Reactions    Ceclor [Cefaclor]     Trimox [Amoxicillin]     Wellbutrin [Bupropion Hcl]          Medications:     Current Outpatient Medications   Medication Instructions    atorvastatin (LIPITOR) 10 mg    BD NANO 2ND GEN PEN NEEDLE 32 gauge x 5/32" Does not apply Needle For 1 injection day    Blood Sugar Diagnostic (ONETOUCH VERIO TEST STRIPS) Strip 1 Strip, Does not apply, 2 TIMES DAILY    carBAMazepine (TEGRETOL  XR) 100 mg Oral Tablet Sustained Release 12 hr TAKE 1 TABLET (=100MG       TOTAL) 3 TIMES A DAY. TAKE WITH 200MG  TABLET.    carBAMazepine (TEGRETOL  XR) 200 mg Oral Tablet Sustained Release 12 hr     DEXCOM G6 SENSOR Does not apply Device CHANGE SENSOR EVERY 10 DAYS    DEXCOM G6 TRANSMITTER Does not apply Device CHANGE EVERY 90 DAYS    DULoxetine  (CYMBALTA DR) 60 mg, 2 TIMES DAILY    folic acid (FOLVITE) 1 mg Oral Tablet     gabapentin (NEURONTIN) 300 mg Oral Capsule     glimepiride (AMARYL) 4 mg Oral Tablet     insulin glargine 100 unit/mL Subcutaneous Insulin Pen Use to inject 28 units at night    Lancets Misc Accu-chek fast clix lancets, test twice daily    losartan-hydrochlorothiazide (HYZAAR) 50-12.5 mg Oral Tablet TAKE 1 TABLET DAILY    metFORMIN (GLUCOPHAGE XR) 500 mg Oral Tablet Sustained Release 24 hr     metoclopramide HCl (REGLAN) 10 mg Oral Tablet Take by mouth    ONETOUCH DELICA PLUS LANCET 33 gauge Does not apply Misc No dose, route, or frequency recorded.    Ozempic 0.5 mg, Subcutaneous, EVERY 7 DAYS    Ozempic 1 mg, EVERY 7 DAYS    pantoprazole (PROTONIX) 40 mg Oral Tablet, Delayed Release (E.C.) 1 Tablet, DAILY    Synthroid 50 mcg, Oral, EVERY MORNING    VRAYLAR 1.5 mg Oral Capsule take 1 capsule by mouth daily  Immunization History:     Immunization History   Administered Date(s) Administered    Covid-19 Vaccine,Pfizer-BioNTech,Purple Top,65yrs+ 09/24/2019, 10/19/2019    Influenza Vaccine, 6 month-adult 03/31/2020, 02/16/2021         Past Medical History:     Past Medical History:   Diagnosis Date    Anxiety     Depression     Diabetes mellitus, type 2 (CMS HCC)     Esophageal reflux     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 She has never used smokeless tobacco. She reports current  alcohol use. She reports being sexually active and has had partner(s) who are female. She reports that she does not use drugs.          Review of Systems: Other than ROS in HPI, all other systems are negative.      Objective:   Vitals:    Vitals:    07/06/23 1309   BP: 118/60   Pulse: 68   Resp: 18   Temp: 36.5 C (97.7 F)   SpO2: 100%   Weight: 115 kg (253 lb 12.8 oz)   Height: 1.575 m (5\' 2" )   BMI: 46.42          Body mass index is 46.42 kg/m.      Constitutional: Alert, well developed, well nourished  HEENT:  Head: NC/AT    Eyes: Sclera anicteric, conjunctiva not injected    Ears: EAC normal, bilateral TMs clear    Nose: No discharge    Throat: MMM, posterior pharynx without erythema or exudate  Neck:   Supple with normal ROM, no cervical LAD, no thyromegaly, no JVD, no carotid bruits  Cardiovascular: RRR, normal S1/S2, no murmurs/rubs/gallops  Pulmonary:  CTAB, equal air entry, nonlabored, no wheezes/crackles/rhonchi  Abdomen:   NABS, NT/ND, soft, no HSM, no masses  Musculoskeletal:  No deformity, no injury, no edema  Neurological:   Alert, oriented x 3, no abnormal tone  Skin:     Warm, pink, dry, no rashes, no jaundice, no pallor, no cyanosis  Psychiatric:  Normal mood, affect, behavior, judgment, and thought content      Data Reviewed:  Results for orders placed or performed in visit on 06/18/23 (from the past 12 weeks)   LIPID PANEL   Result Value Ref Range    TRIGLYCERIDES 157     CHOLESTEROL 162     HDL-CHOLESTEROL 46     LDL (CALCULATED) 91     VLDL (CALCULATED) 3.5     NON - HDL (CALCULATED) 116  HGA1C (HEMOGLOBIN A1C WITH EST AVG GLUCOSE)   Result Value Ref Range    HEMOGLOBIN A1C 5.8    COMPREHENSIVE METABOLIC PNL, FASTING   Result Value Ref Range    ALBUMIN 2.1     ALKALINE PHOSPHATASE 90     ALT (SGPT) 12     AST (SGOT) 10     BILIRUBIN, TOTAL 0.4     BUN 10     CALCIUM 9.1     CARBON DIOXIDE 29     CHLORIDE 92     CREATININE 0.65     ESTIMATED GLOMERULAR FILTRATION RATE 109     GLUCOSE, FASTING  114     POTASSIUM 4.0     SODIUM 4.0     TOTAL PROTEIN 6.8            Assessment & Plan:     1. Well adult exam (Primary)      2. Acquired hypothyroidism  Stable on meds    3. Trigeminal neuralgia of right side of face  Under care neurologist    4. Essential hypertension  Well controlled with current regimen which we will continue.        5. Hyperlipidemia, unspecified hyperlipidemia type  Continue statin.  Lipid panel at goal within past 6 months.      6. Hyponatremia  Due to carbemazepine  Will discuss with neurologist    7. Gastroparesis  Recommend switch to mounjaro but she wants to discuss with endocrinologist                  Health Maintenance   Topic Date Due    NonMedicare Preventative Exam  Never done    Hepatitis C screening  Never done    HIV Screening  Never done    Adult Tdap-Td (1 - Tdap) Never done    Hepatitis B Vaccine (1 of 3 - 19+ 3-dose series) Never done    Pneumococcal Vaccine, Age 51-49 (1 of 2 - PCV) Never done    Pap smear/HPV  Never done    Breast Cancer Screening  07/05/2019    Colonoscopy  Never done    Influenza Vaccine (1) 01/07/2023    Covid-19 Vaccine (3 - 2024-25 season) 01/07/2023    Diabetic Kidney Health Microalb/Cr Ratio  10/07/2023    Depression Screening  10/18/2023    Diabetic A1C  12/13/2023    Diabetic Kidney Health eGFR  06/18/2024    Diabetic Retinal Exam  01/18/2025       No follow-ups on file.        Bennie Hind, DO

## 2023-07-06 NOTE — Nursing Note (Signed)
 07/06/23 1300   Domestic Violence   Because we are aware of abuse and domestic violence today, we ask all patients: Are you being hurt, hit, or frightened by anyone at your home or in your life?  N   Basic Needs   Do you have any basic needs within your home that are not being met? (such as Food, Shelter, [employer], Tranportation, paying for bills and/or medications) N

## 2023-07-09 ENCOUNTER — Ambulatory Visit (INDEPENDENT_AMBULATORY_CARE_PROVIDER_SITE_OTHER): Payer: Self-pay | Admitting: Family

## 2023-07-23 ENCOUNTER — Ambulatory Visit (INDEPENDENT_AMBULATORY_CARE_PROVIDER_SITE_OTHER): Payer: Self-pay

## 2023-08-14 ENCOUNTER — Encounter (INDEPENDENT_AMBULATORY_CARE_PROVIDER_SITE_OTHER): Payer: Self-pay

## 2023-08-22 ENCOUNTER — Ambulatory Visit (INDEPENDENT_AMBULATORY_CARE_PROVIDER_SITE_OTHER)

## 2023-09-13 ENCOUNTER — Ambulatory Visit (HOSPITAL_COMMUNITY): Payer: Self-pay | Admitting: Student in an Organized Health Care Education/Training Program

## 2023-09-25 ENCOUNTER — Encounter (INDEPENDENT_AMBULATORY_CARE_PROVIDER_SITE_OTHER): Payer: Self-pay

## 2023-10-02 ENCOUNTER — Other Ambulatory Visit: Payer: Self-pay

## 2023-10-03 ENCOUNTER — Encounter (INDEPENDENT_AMBULATORY_CARE_PROVIDER_SITE_OTHER): Payer: Self-pay | Admitting: Family Medicine

## 2023-10-03 ENCOUNTER — Ambulatory Visit (INDEPENDENT_AMBULATORY_CARE_PROVIDER_SITE_OTHER): Payer: Self-pay | Admitting: Family Medicine

## 2023-10-03 ENCOUNTER — Ambulatory Visit

## 2023-10-03 ENCOUNTER — Encounter (INDEPENDENT_AMBULATORY_CARE_PROVIDER_SITE_OTHER): Payer: Self-pay

## 2023-10-03 ENCOUNTER — Other Ambulatory Visit: Payer: Self-pay

## 2023-10-03 VITALS — BP 114/68 | HR 72 | Ht 62.0 in | Wt 255.0 lb

## 2023-10-03 DIAGNOSIS — I10 Essential (primary) hypertension: Secondary | ICD-10-CM | POA: Insufficient documentation

## 2023-10-03 DIAGNOSIS — E1169 Type 2 diabetes mellitus with other specified complication: Secondary | ICD-10-CM

## 2023-10-03 DIAGNOSIS — R079 Chest pain, unspecified: Secondary | ICD-10-CM | POA: Insufficient documentation

## 2023-10-03 DIAGNOSIS — Z7985 Long-term (current) use of injectable non-insulin antidiabetic drugs: Secondary | ICD-10-CM

## 2023-10-03 DIAGNOSIS — Z794 Long term (current) use of insulin: Secondary | ICD-10-CM

## 2023-10-03 DIAGNOSIS — Z7984 Long term (current) use of oral hypoglycemic drugs: Secondary | ICD-10-CM

## 2023-10-03 DIAGNOSIS — E782 Mixed hyperlipidemia: Secondary | ICD-10-CM | POA: Insufficient documentation

## 2023-10-03 DIAGNOSIS — M797 Fibromyalgia: Secondary | ICD-10-CM

## 2023-10-03 DIAGNOSIS — R0789 Other chest pain: Secondary | ICD-10-CM

## 2023-10-03 DIAGNOSIS — Z6841 Body Mass Index (BMI) 40.0 and over, adult: Secondary | ICD-10-CM

## 2023-10-03 LAB — ECG 12 LEAD
Atrial Rate: 75 {beats}/min
Calculated P Axis: 49 degrees
Calculated R Axis: 31 degrees
Calculated T Axis: 48 degrees
PR Interval: 158 ms
QRS Duration: 82 ms
QT Interval: 386 ms
QTC Calculation: 431 ms
Ventricular rate: 75 {beats}/min

## 2023-10-03 NOTE — Telephone Encounter (Signed)
 Regarding: Clinical Question  ----- Message from Dayne Even sent at 10/02/2023  2:36 PM EDT -----  Copied From CRM [id number].Dawn Michael () called with a clinical question.     Requesting that her HGA1C order be put in her MyChart.

## 2023-10-03 NOTE — Progress Notes (Signed)
 Encounter Date: 10/03/2023    Referring Provider:  No ref. provider found    Impression and Plan:   Reassured that the pain is musculoskeletal  Suggested should increase physical activity  Discussed weight management and weight management referral wants to discuss this with her PCP  Discussed how exercise will help with her symptoms of fibromyalgia    Fu as needed       Chief Complaint   Patient presents with    New Patient     CP       Current Medical Problems:  History of fibromyalgia.  Long standing problem with localized left sided chest pain that is only noticeable at rest.  Pain radiates into the back and goes into the axilla but no arm radiation.  Able bring grocery from the car to the kitchen without chest pain or shortness of breath.  Diabetes with problem with weight gain.  Sleep apnea and CPAP.  Trigeminal neuralgia.    Patient Active Problem List   Diagnosis    Headaches    Essential hypertension    Acquired hypothyroidism    Gastroesophageal reflux disease with esophagitis    Type 2 diabetes mellitus without complication, without long-term current use of insulin    Abdominal bloating    Diabetic gastroparesis (CMS HCC)  (CMS HCC)    Gastro-esophageal reflux disease without esophagitis    Gluten-sensitive enteropathy    Irritable bowel syndrome without diarrhea    Polycystic ovarian syndrome    OSA on CPAP    Mixed hyperlipidemia    Pseudotumor cerebri    Von Willebrand disease (CMS HCC)       History of Present Illness:    Past Medical History:   Diagnosis Date    Anxiety     Depression     Diabetes mellitus, type 2     Esophageal reflux     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)     Fibromyalgia     Gastroparesis     Hypertension     Hypothyroidism          Past Surgical History:   Procedure Laterality Date    ANKLE SURGERY      HX CERVICAL POLYPECTOMY      HX CHOLECYSTECTOMY           Family Medical History:       Problem Relation (Age of Onset)    Cancer Father    Diabetes Mother    High  Cholesterol Mother    Hypertension (High Blood Pressure) Mother    Thyroid  Disease Mother            Social History     Socioeconomic History    Marital status: Married   Tobacco Use    Smoking status: Never    Smokeless tobacco: Never   Vaping Use    Vaping status: Never Used   Substance and Sexual Activity    Alcohol use: Yes     Comment: Occasional     Drug use: Never    Sexual activity: Yes     Partners: Male     Social Determinants of Health     Financial Resource Strain: Low Risk  (10/03/2023)    Financial Resource Strain     SDOH Financial: No   Transportation Needs: Low Risk  (10/03/2023)    Transportation Needs     SDOH Transportation: No   Social Connections: Low Risk  (10/03/2023)    Social Connections  SDOH Social Isolation: 5 or more times a week   Intimate Partner Violence: Low Risk  (10/03/2023)    Intimate Partner Violence     SDOH Domestic Violence: No   Housing Stability: Low Risk  (10/03/2023)    Housing Stability     SDOH Housing Situation: I have housing.     SDOH Housing Worry: No     Allergies   Allergen Reactions    Ceclor [Cefaclor]     Trimox [Amoxicillin]     Wellbutrin [Bupropion Hcl]     Bupropion Itching         Current Medication List:  Current Outpatient Medications   Medication Sig    atorvastatin (LIPITOR) 10 mg Oral Tablet Take 1 Tablet (10 mg total) by mouth    BD NANO 2ND GEN PEN NEEDLE 32 gauge x 5/32" Does not apply Needle For 1 injection day    Blood Sugar Diagnostic (ONETOUCH VERIO TEST STRIPS) Strip 1 Strip by Does not apply route Twice daily    carBAMazepine (TEGRETOL  XR) 100 mg Oral Tablet Sustained Release 12 hr TAKE 1 TABLET (=100MG       TOTAL) 3 TIMES A DAY. TAKE WITH 200MG  TABLET.    carBAMazepine (TEGRETOL  XR) 200 mg Oral Tablet Sustained Release 12 hr     DEXCOM G6 SENSOR Does not apply Device CHANGE SENSOR EVERY 10 DAYS    DEXCOM G6 TRANSMITTER Does not apply Device CHANGE EVERY 90 DAYS    DULoxetine (CYMBALTA DR) 60 mg Oral Capsule, Delayed Release(E.C.) Take 1  Capsule (60 mg total) by mouth Twice daily    folic acid (FOLVITE) 1 mg Oral Tablet     gabapentin (NEURONTIN) 300 mg Oral Capsule  (Patient not taking: Reported on 10/03/2023)    glimepiride (AMARYL) 4 mg Oral Tablet     insulin glargine 100 unit/mL Subcutaneous Insulin Pen 32 unit PM    Lancets Misc Accu-chek fast clix lancets, test twice daily    losartan -hydrochlorothiazide  (HYZAAR ) 50-12.5 mg Oral Tablet TAKE 1 TABLET DAILY    metFORMIN  (GLUCOPHAGE  XR) 500 mg Oral Tablet Sustained Release 24 hr     metoclopramide HCl (REGLAN) 10 mg Oral Tablet Take by mouth PRN    ONETOUCH DELICA PLUS LANCET 33 gauge Does not apply Misc     OZEMPIC  1 mg/dose (4 mg/3 mL) Subcutaneous Pen Injector Inject 1 mg under the skin Every 7 days    pantoprazole (PROTONIX) 40 mg Oral Tablet, Delayed Release (E.C.) Take 1 Tablet (40 mg total) by mouth Daily    semaglutide  (OZEMPIC ) 0.25 mg or 0.5 mg(2 mg/1.5 mL) Subcutaneous Pen Injector 0.5 mg by Subcutaneous route Every 7 days for 90 days    SYNTHROID  50 mcg Oral Tablet Take 1 Tablet (50 mcg total) by mouth Every morning    VRAYLAR 1.5 mg Oral Capsule take 1 capsule by mouth daily       Review of Systems:   Please see problem list and history as documented above.    Physical Exam:    Vitals:    10/03/23 0949   BP: 114/68   Pulse: 72   SpO2: 97%   Weight: 116 kg (255 lb)   Height: 1.575 m (5\' 2" )   BMI: 46.64           Body mass index is 46.64 kg/m.    General Appearance: well appearing, obese, in no apparent distress   Head: normocephalic/atraumatic   Eyes: pupils round and reactive to light and accommodating, EOMI,  sclera an icteric   ENT: Normal hearing normal dentition, throat clear, no ulcerations, tympanic membranes clear   Cardiovascular:  Chest symmetrical , PMI in fifth intercostal space, S1 and S2 regular, no murmur, no gallop, no rub.   Respiratory: Chest expands equally, good air entry bilaterally, no wheezing, no Ronchi,no rales   Gastrointestinal: Abdomin obese , soft ,  non-tender, no organomegaly, no masses ,bowel sounds positive.   Musculoskeletal: No arthritis no clubbing no cyanosis no kyphosis or scoliosis, gait appears normal, muscle strength was normal with no evidence of muscular atrophy   Skin: moist no evidence of stasis, dermatitis or ulcers   Neurological: oriented x3 ,all carnial nerves intact,power equal in all limbs,no abnormal movements see,no parkinsinism   Psychiatric: speech, memory, cognition intact     Review/Management:   Labs reviewed   Radiology/Cardiology Results: Reviewed    Once again thank you very much for giving me the opportunity to participate in this patients care.   Brendon Caller, MD      Note: This note was dictated using voice recognition software.   Variance in spelling and vocabulary are possible and unintentional.   Not all errors may be caught/corrected.   Please notify the attending physician if any discrepancies are noted or if the meaning of any statement is unclear.

## 2023-10-05 ENCOUNTER — Encounter (INDEPENDENT_AMBULATORY_CARE_PROVIDER_SITE_OTHER): Payer: Self-pay | Admitting: Family Medicine

## 2023-10-05 DIAGNOSIS — E119 Type 2 diabetes mellitus without complications: Secondary | ICD-10-CM

## 2023-10-08 ENCOUNTER — Other Ambulatory Visit: Payer: Self-pay

## 2023-10-08 ENCOUNTER — Encounter (HOSPITAL_COMMUNITY): Payer: Self-pay

## 2023-10-08 ENCOUNTER — Encounter (INDEPENDENT_AMBULATORY_CARE_PROVIDER_SITE_OTHER): Payer: Self-pay | Admitting: Family Medicine

## 2023-10-08 ENCOUNTER — Ambulatory Visit (INDEPENDENT_AMBULATORY_CARE_PROVIDER_SITE_OTHER): Admitting: Family Medicine

## 2023-10-08 VITALS — BP 110/60 | HR 83 | Temp 97.3°F | Resp 16 | Ht 62.0 in | Wt 252.8 lb

## 2023-10-08 DIAGNOSIS — E119 Type 2 diabetes mellitus without complications: Secondary | ICD-10-CM

## 2023-10-08 DIAGNOSIS — E782 Mixed hyperlipidemia: Secondary | ICD-10-CM

## 2023-10-08 DIAGNOSIS — G500 Trigeminal neuralgia: Secondary | ICD-10-CM | POA: Insufficient documentation

## 2023-10-08 DIAGNOSIS — I517 Cardiomegaly: Secondary | ICD-10-CM

## 2023-10-08 DIAGNOSIS — Z6841 Body Mass Index (BMI) 40.0 and over, adult: Secondary | ICD-10-CM

## 2023-10-08 DIAGNOSIS — E1169 Type 2 diabetes mellitus with other specified complication: Secondary | ICD-10-CM

## 2023-10-08 DIAGNOSIS — E66813 Obesity, class 3: Secondary | ICD-10-CM

## 2023-10-08 DIAGNOSIS — I10 Essential (primary) hypertension: Secondary | ICD-10-CM

## 2023-10-08 NOTE — Progress Notes (Signed)
 OUTPATIENT PROGRESS NOTE    Subjective:   Patient ID:  Dawn Michael is a pleasant 48 y.o. female.    Chief Complaint: Essential Hypertension, Hyperlipidemia, and Diabetes      History of Present Illness:  DM: Fasting FS range    Nonfasting FS range    Hypoglycemia episodes  No   Compliant with diabetic diet Yes   Sores on feet No    HEMOGLOBIN A1C   Date Value Ref Range Status   04/13/2022 6.2  Final     Results in Last 18 Months   Lab Test 10/07/22  0000   MICALBRNUR 81   MICALBCRERAT 5         Wt Readings from Last 3 Encounters:   10/08/23 115 kg (252 lb 12.8 oz)   10/03/23 116 kg (255 lb)   07/06/23 115 kg (253 lb 12.8 oz)   HTN: HAs     Dizziness No   Feeling like BP is too high or low No   Compliant with taking meds Yes   Home BP:  did not bring log    BP Readings from Last 3 Encounters:   10/08/23 110/60   10/03/23 114/68   07/06/23 118/60     HLD: tolerating cholosterol lowering medication     low fat/ cholesterol   Is pt fasting today? No  Lab Results   Component Value Date    CHOLESTEROL 162 06/15/2023    HDLCHOL 46 06/15/2023    LDLCHOL 91 06/15/2023    TRIG 157 06/15/2023      Lab Results   Component Value Date    AST 10 06/15/2023    ALT 12 06/15/2023     Having atypical CP and had f/u cardio Dr Dawn Michael and feels he should have ordered echo dur to EKG reading            The history is provided by the patient.      Allergies:     Allergies   Allergen Reactions    Ceclor [Cefaclor]     Trimox [Amoxicillin]     Wellbutrin [Bupropion Hcl]     Bupropion Itching         Medications:     Current Outpatient Medications   Medication Instructions    atorvastatin (LIPITOR) 10 mg    BD NANO 2ND GEN PEN NEEDLE 32 gauge x 5/32" Does not apply Needle For 1 injection day    Blood Sugar Diagnostic (ONETOUCH VERIO TEST STRIPS) Strip 1 Strip, Does not apply, 2 TIMES DAILY    carBAMazepine (TEGRETOL  XR) 100 mg Oral Tablet Sustained Release 12 hr TAKE 1 TABLET (=100MG       TOTAL) 3 TIMES A DAY. TAKE WITH 200MG  TABLET.     carBAMazepine (TEGRETOL  XR) 200 mg Oral Tablet Sustained Release 12 hr     DEXCOM G6 SENSOR Does not apply Device CHANGE SENSOR EVERY 10 DAYS    DEXCOM G6 TRANSMITTER Does not apply Device CHANGE EVERY 90 DAYS    DULoxetine (CYMBALTA DR) 60 mg, 2 TIMES DAILY    folic acid (FOLVITE) 1 mg Oral Tablet     glimepiride (AMARYL) 4 mg Oral Tablet     insulin glargine 100 unit/mL Subcutaneous Insulin Pen 32 unit PM    Lancets Misc Accu-chek fast clix lancets, test twice daily    losartan -hydrochlorothiazide  (HYZAAR ) 50-12.5 mg Oral Tablet TAKE 1 TABLET DAILY    metFORMIN  (GLUCOPHAGE  XR) 500 mg Oral Tablet Sustained Release 24 hr  metoclopramide HCl (REGLAN) 10 mg Oral Tablet Take by mouth PRN    ONETOUCH DELICA PLUS LANCET 33 gauge Does not apply Misc No dose, route, or frequency recorded.    Ozempic  0.5 mg, Subcutaneous, EVERY 7 DAYS    Ozempic  1 mg, EVERY 7 DAYS    pantoprazole (PROTONIX) 40 mg Oral Tablet, Delayed Release (E.C.) 1 Tablet, Daily    Synthroid  50 mcg, Oral, EVERY MORNING    VRAYLAR 1.5 mg Oral Capsule take 1 capsule by mouth daily         Immunization History:     Immunization History   Administered Date(s) Administered    Covid-19 Vaccine,Pfizer-BioNTech,Purple Top,60yrs+ 09/24/2019, 10/19/2019    Influenza Vaccine, 6 month-adult 03/31/2020, 02/16/2021         Past Medical History:     Past Medical History:   Diagnosis Date    Anxiety     Depression     Diabetes mellitus, type 2     Esophageal reflux     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 She has never used smokeless tobacco. She reports current alcohol use. She reports being sexually active and has had partner(s) who are female. She reports that she does not use drugs.          Review of Systems: Other than ROS in HPI, all other systems are negative.      Objective:   Vitals:    Vitals:    10/08/23 1050   BP: 110/60   Pulse: 83   Resp: 16   Temp: 36.3 C (97.3 F)   SpO2: 97%   Weight: 115 kg (252 lb 12.8 oz)   Height: 1.575 m (5\' 2" )   BMI: 46.24          Body mass index is 46.24 kg/m.      Constitutional: Alert, well developed, well nourished  HEENT:  Head: NC/AT    Eyes: Sclera anicteric, conjunctiva not injected    Ears: EAC normal, bilateral TMs clear    Nose: No discharge    Throat: MMM, posterior pharynx without erythema or exudate  Neck:   Supple with normal ROM, no cervical LAD, no thyromegaly, no JVD, no carotid bruits  Cardiovascular: RRR, normal S1/S2, no murmurs/rubs/gallops  Pulmonary:  CTAB, equal air entry, nonlabored, no wheezes/crackles/rhonchi  Abdomen:   NABS, NT/ND, soft, no HSM, no masses  Musculoskeletal:  No deformity, no injury, no edema  Neurological:   Alert, oriented x 3, no abnormal tone  Skin:     Warm, pink, dry, no rashes,  no jaundice, no pallor, no cyanosis  Psychiatric:  Normal mood, affect, behavior, judgment, and thought content    No results found for this or any previous visit (from the past 12 weeks).      Assessment & Plan:   1. Type 2 diabetes mellitus without complication, without long-term current use of insulin (Primary)  Continue current meds, including ACE-I and statin.  Urine microalbumin UTD.  Annual eye exam UTD.  Podiatry not indicated.  Dietary compliance stressed.  Recommend checking FS QD - BID.      2. Essential hypertension  Well controlled with current regimen which we will continue.        3. Mixed hyperlipidemia  Continue statin.  Lipid panel at goal within  past 6 months.      4. Left atrial enlargement    - TRANSTHORACIC ECHOCARDIOGRAM - ADULT COMPLETE; Future    5. Morbid obesity with BMI of 45.0-49.9, adult (CMS HCC)    - Medical Weight Management (BMI >30 ONLY) POC [place]; Future    6. Trigeminal neuralgia  Stable on lamictal        Depression screening is negative. PHQ 2 Total: 0                 Health Maintenance   Topic Date Due    Hepatitis C screening  Never done    HIV Screening  Never done    Adult Tdap-Td (1 - Tdap) Never done    Hepatitis B Vaccine (1 of 3 - 19+ 3-dose series) Never done    Pneumococcal Vaccine, Age 56-49 (1 of 2 - PCV) Never done    Pap smear/HPV  Never done    Breast Cancer Screening  07/05/2019    Colonoscopy  Never done    Covid-19 Vaccine (3 - 2024-25 season) 01/07/2023    Diabetic Kidney Health Microalb/Cr Ratio  10/07/2023    Influenza Vaccine (Season Ended) 01/07/2024    Diabetic Retinal Exam  01/19/2024    Diabetic A1C  04/05/2024    Diabetic Kidney Health eGFR  06/18/2024    NonMedicare Preventative Exam  07/05/2024    Depression Screening  10/07/2024       No follow-ups on file.        Shana Daring, DO

## 2023-10-08 NOTE — Nursing Note (Signed)
 10/08/23 1000   Depression Screen   Little interest or pleasure in doing things. 0   Feeling down, depressed, or hopeless 0   PHQ 2 Total 0

## 2023-10-21 ENCOUNTER — Other Ambulatory Visit (INDEPENDENT_AMBULATORY_CARE_PROVIDER_SITE_OTHER): Payer: Self-pay | Admitting: Family Medicine

## 2023-10-21 ENCOUNTER — Encounter (INDEPENDENT_AMBULATORY_CARE_PROVIDER_SITE_OTHER): Payer: Self-pay | Admitting: Family Medicine

## 2023-10-21 ENCOUNTER — Other Ambulatory Visit: Payer: Self-pay

## 2023-10-22 ENCOUNTER — Other Ambulatory Visit (INDEPENDENT_AMBULATORY_CARE_PROVIDER_SITE_OTHER): Payer: Self-pay | Admitting: Family Medicine

## 2023-10-22 ENCOUNTER — Ambulatory Visit
Payer: Self-pay | Attending: Student in an Organized Health Care Education/Training Program | Admitting: Student in an Organized Health Care Education/Training Program

## 2023-10-22 ENCOUNTER — Encounter (HOSPITAL_COMMUNITY): Payer: Self-pay | Admitting: Student in an Organized Health Care Education/Training Program

## 2023-10-22 VITALS — BP 152/88 | HR 84 | Temp 97.5°F | Resp 16 | Ht 62.0 in | Wt 249.4 lb

## 2023-10-22 DIAGNOSIS — Z9989 Dependence on other enabling machines and devices: Secondary | ICD-10-CM

## 2023-10-22 DIAGNOSIS — G4733 Obstructive sleep apnea (adult) (pediatric): Secondary | ICD-10-CM | POA: Insufficient documentation

## 2023-10-22 DIAGNOSIS — E66813 Obesity, class 3: Secondary | ICD-10-CM

## 2023-10-22 DIAGNOSIS — Z6841 Body Mass Index (BMI) 40.0 and over, adult: Secondary | ICD-10-CM

## 2023-10-22 MED ORDER — METFORMIN ER 500 MG TABLET,EXTENDED RELEASE 24 HR
500.0000 mg | ORAL_TABLET | Freq: Every day | ORAL | 0 refills | Status: AC
Start: 2023-10-22 — End: ?

## 2023-10-22 NOTE — Patient Instructions (Signed)
-   Continue CPAP 13 cm H2O, nasal mask, DME [facility]  - Try to obtain at least 7-8 hours of sleep each night, avoid alcohol and electronic screen exposure prior to bedtime, avoid caffeine intake after 12 noon, maintain restful bedtime environment, utilize bed only for sleep and sex  - Weight management encouraged    Sleep apnea is a condition that can lead to excessive daytime sleepiness. For the safety of yourself, as well as others, it is strongly recommended not to drive or operate heavy machinery when drowsy or sleepy.

## 2023-10-22 NOTE — Telephone Encounter (Signed)
 Last Visit:10/08/2023     Upcoming appointments: 10/22/2023           Darci East, MA  10/22/2023, 09:01

## 2023-10-22 NOTE — Telephone Encounter (Signed)
 Last Visit:10/08/2023     Upcoming appointments: 01/09/2024           Darci East, MA  10/22/2023, 07:30

## 2023-10-22 NOTE — Progress Notes (Signed)
 PULMONARY AND SLEEP MEDICINE, [facility]  Operated by [facility]  [address] [place] [address]  Dept: [phone]  Dept Fax: [phone]     FOLLOW UP VISIT    Name: Dawn Michael MRN: [id number]   DOB: 1975-09-02 Age: 48 y.o.     Date of Visit: 10/22/2023    Chief Complaint: OSA    IMPRESSION:  48 y.o. female never smoker with:  Severe OSA (AHI 41) on autoCPAP with reported good adherence and clinical response/benefit (99%  > 4 hrs, residual AHI 0.2)  Morbid obesity, BMI 45    Pertinent chronic medical issues:  HTN  DM2  GERD  Hypothyroidism  Fibromyalgia  Trigeminal neuralgia  Depression  Morbid obesity    The risks of untreated OSA including increased risk for cardiovascular and cerebrovascular disease were discussed.     PLAN:  - Continue CPAP 13 cm H2O, nasal mask, DME DHS  - DME to link [facility] to [facility] provider portal  - Patient counseled to obtain at least 7-8 hours of sleep each night, avoid alcohol and electronic screen exposure prior to bedtime, avoid caffeine intake after 12 noon, maintain restful bedtime environment, utilize bed only for sleep and sex  - Weight management encouraged    Return in about 1 year (around 10/21/2024) for In Person Visit.      SLEEP STUDY RESULTS:   11/20/2011: PSG showed AHI 41.   12/02/2011: CPAP titration showed effective CPAP 13.     COMPLIANCE DATA:  From device:  CPAP 13 cm H2O  29/30 days  Avg usage 10.4 hrs  Residual AHI 0.2    HISTORY:  Dawn Michael is a 48 y.o. female never smoker with a medical history of HTN, DM2, GERD, Hypothyroidism, Fibromyalgia, Trigeminal neuralgia, depression, morbid obesity, who presents with OSA and is here for follow up today.    Interval history:  10/22/2023: Last seen 11/2/22023. Using CPAP regularly. Is now working as a Merchant navy officer. ESS 6/24. Feels well and attributes residual fatigue to other conditions.    Denies cough / sputum / fever / shortness of breath / wheezing / chest pain  / abdominal pain / nausea / diarrhea / weight loss / hemoptysis.    Change in respiratory medications:  Change in sleep / psychiatric / neurologic medications: Now on carbamazepine. Continues to take duloxetine, cariprazine.    ----------------  Brief synopsis of sleep complaints:  03/29/2022: OSA diagnosed 2013 due to fatigue. Currently using [employer] iBreeze CPAP with nasal mask. DME DHS. Husband works 2 jobs and is not consistently in the bed which disturbs sleep.   ----------------      VPXTGGYI Weekends   Bedtime 930 PM - 12 MN 9 PM   Sleep latency 30 min (prev variable)     Nocturnal awakenings 2x (variable)     Return latency       Wake up time 630 AM    Naps 7-11 noon (less while working) Variable   Sleep hygiene: Crotchets before bed      ROS: Other than those mentioned in the HPI, ROS was negative.    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  Tobacco: Never smoker  Caffeine use: 2 cups coffee, none past 2 PM    PHYSICAL EXAMINATION:  BP (!) 152/88   Pulse 84   Temp  36.4 C (97.5 F)   Resp 16   Ht 1.575 m (5' 2)   Wt 113 kg (249 lb 6.4 oz)   SpO2 99%   BMI 45.62 kg/m     General: Seated, in no acute distress  HEENT: Atraumatic normocephalic. Crowded OP. Bulky tongue. Mallampatti 4. Tonsils 1+ BL. No dental malocclusion. No retrognathia or micrognathia. Nasal septum not deviated.  Neck: Trachea in the midline. Thyromental distance WNL.  Cardiovascular: S1-S2 regular. No murmur.  Lungs: Equal air entry bilaterally. No rhonchi or wheezing. No crepitations.  Abdomen: Soft, nontender. No rebound or guarding. Active bowel sounds.  Musculoskeletal: No joint swelling or erythema or tenderness  Extremities: No edema clubbing or cyanosis.  Skin: Dry, warm to touch. Normal turgor.   Neurological: Awake, alert , oriented. Nonfocal.  Psychiatric: Normal mood and affect.       IMAGING STUDIES (PERSONALLY REVIEWED):  No chest imaging available for review.     OTHER STUDIES:   Echocardiogram: N/A    Past Medical History[1]  Past Surgical History[2]  Family History[3]  Social History[4]  Allergies[5]  Current Outpatient Medications   Medication Instructions    atorvastatin (LIPITOR) 10 mg    BD NANO 2ND GEN PEN NEEDLE 32 gauge x 5/32 Does not apply Needle For 1 injection day    Blood Sugar Diagnostic (ONETOUCH VERIO TEST STRIPS) Strip 1 Strip, Does not apply, 2 TIMES DAILY    carBAMazepine (TEGRETOL  XR) 100 mg Oral Tablet Sustained Release 12 hr TAKE 1 TABLET (=100MG       TOTAL) 3 TIMES A DAY. TAKE WITH 200MG  TABLET.    DEXCOM G6 SENSOR Does not apply Device CHANGE SENSOR EVERY 10 DAYS    DEXCOM G6 TRANSMITTER Does not apply Device CHANGE EVERY 90 DAYS    DULoxetine (CYMBALTA DR) 60 mg, 2 TIMES DAILY    folic acid (FOLVITE) 1 mg Oral Tablet     glimepiride (AMARYL) 4 mg Oral Tablet     insulin glargine 100 unit/mL Subcutaneous Insulin Pen 32 unit PM    Lancets Misc Accu-chek fast clix lancets, test twice daily    losartan -hydrochlorothiazide  (HYZAAR ) 50-12.5 mg Oral Tablet 1 Tablet, Oral, Daily    metFORMIN  (GLUCOPHAGE  XR) 500 mg, Oral, Daily    metoclopramide HCl (REGLAN) 10 mg Oral Tablet Take by mouth PRN    ONETOUCH DELICA PLUS LANCET 33 gauge Does not apply Misc No dose, route, or frequency recorded.    Ozempic  0.5 mg, Subcutaneous, EVERY 7 DAYS    Ozempic  1 mg, EVERY 7 DAYS    pantoprazole (PROTONIX) 40 mg Oral Tablet, Delayed Release (E.C.) 1 Tablet, Daily    Synthroid  50 mcg, Oral, EVERY MORNING    VRAYLAR 1.5 mg Oral Capsule take 1 capsule by mouth daily       Objective   LABORATORY FINDINGS:      Last BMP  (Last result in the past 2 years)        Na   K   Cl   CO2   BUN   Cr   Calcium   Glucose   Glucose-Fasting        06/15/23 0000 4.0   4.0   92   29   10   0.65   9.1     114             Last Hepatic Panel  (Last result in the past 2 years)        Albumin   Total  PTN    Total Bili   Direct Bili   Ast/SGOT   Alt/SGPT   Alk Phos        06/15/23 0000 2.1   6.8   0.4     10   12    90               IMAGING RESULTS:            Problem List Items Addressed This Visit          Respiratory    Severe OSA (AHI 41) on CPAP - Primary    Relevant Orders    DME - HOME PAP (BIPAP/CPAP) DEVICE OR SUPPLIES (POSITIVE AIRWAY PRESSURE DEVICE)       Total time spent on encounter was 32 minutes. In addition to face to face time with the patient performing history and exam, this includes time spent before and after the visit reviewing records, personally reviewing prior imaging, reviewing prior cardiac studies as available, reviewing prior outpatient notes as available, reviewing the patient's medication record, coordinating with office staff and time spent performing final documentation in the medical record. This excludes procedural time.    Kyla Phlegm, MD    This note is structured for readability, and certain sections have been moved accordingly.    Parts of this note were dictated using 41M (tm) M*Modal Fluency Direct, and may contain unintended transcription errors despite review.         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)     Fibromyalgia     Gastroparesis     Hypertension     Hypothyroidism    [2]   Past Surgical History:  Procedure Laterality Date    ANKLE SURGERY      HX CERVICAL POLYPECTOMY      HX CHOLECYSTECTOMY     [3]   Family History  Problem Relation Name Age of Onset    Diabetes Mother      High Cholesterol Mother      Hypertension (High Blood Pressure) Mother      Thyroid  Disease Mother      Cancer Father     [4]   Social History  Socioeconomic History    Marital status: Married   Tobacco Use    Smoking status: Never    Smokeless tobacco: Never   Vaping Use    Vaping status: Never Used   Substance and Sexual Activity    Alcohol use: Yes     Comment:  Occasional     Drug use: Never    Sexual activity: Yes     Partners: Male     Social Determinants of Health     Financial Resource Strain: Low Risk  (10/08/2023)    Financial Resource Strain     SDOH Financial: No   Transportation Needs: Low Risk  (10/08/2023)    Transportation Needs     SDOH Transportation: No   Social Connections: Unknown (10/08/2023)    Social Connections     SDOH Social Isolation: Patient chooses not to answer   Intimate Partner Violence: Unknown (10/08/2023)    Intimate Partner Violence     SDOH Domestic Violence: Patient chooses not to answer.   Housing Stability: Low Risk  (10/08/2023)    Housing Stability  SDOH Housing Situation: I have housing.     SDOH Housing Worry: No   [5]   Allergies  Allergen Reactions    Ceclor [Cefaclor]     Trimox [Amoxicillin]     Wellbutrin [Bupropion Hcl]     Bupropion Itching

## 2023-10-22 NOTE — Nursing Note (Signed)
 10/22/23 1300   Situation   Sitting and Reading 1   Watching TV 1   Sitting inactive in a public place. 0   As a passenger in a car for an hour without a break. 0   Lying down to rest in the afternoon when circumstances permit. 3   Sitting and Talking to someone 0   Sitting quietly after a lunch without alcohol 1   In a car, while stopped for a few minutes in traffic 0   Epworth Sleepiness Scale Score   Score total 6     Neck Circ - 16 inches

## 2023-10-25 ENCOUNTER — Ambulatory Visit (INDEPENDENT_AMBULATORY_CARE_PROVIDER_SITE_OTHER): Payer: Self-pay | Admitting: Family Medicine

## 2023-10-25 ENCOUNTER — Ambulatory Visit
Admission: RE | Admit: 2023-10-25 | Discharge: 2023-10-25 | Disposition: A | Discharge status: Home / Self Care | Payer: Self-pay | Source: Ambulatory Visit | Attending: Family Medicine | Admitting: Family Medicine

## 2023-10-25 ENCOUNTER — Other Ambulatory Visit: Payer: Self-pay

## 2023-10-25 DIAGNOSIS — I517 Cardiomegaly: Secondary | ICD-10-CM | POA: Insufficient documentation

## 2023-10-25 LAB — TRANSTHORACIC ECHOCARDIOGRAM - ADULT
EF VISUAL ESTIMATE: 55
EF: 60

## 2023-10-25 NOTE — Telephone Encounter (Signed)
-----   Message from [place], [place] sent at 10/25/2023  9:57 AM EDT -----  2D cardiac echo normal  ----- Message -----  From: [facility] Results  Sent: 10/25/2023   9:48 AM EDT  To: Shana Daring, DO

## 2023-10-30 ENCOUNTER — Other Ambulatory Visit (INDEPENDENT_AMBULATORY_CARE_PROVIDER_SITE_OTHER): Payer: Self-pay | Admitting: Family Medicine

## 2023-10-30 MED ORDER — METFORMIN ER 500 MG TABLET,EXTENDED RELEASE 24 HR
1000.0000 mg | ORAL_TABLET | Freq: Two times a day (BID) | ORAL | 0 refills | Status: DC
Start: 2023-10-30 — End: 2024-02-28

## 2023-10-30 NOTE — Telephone Encounter (Signed)
 Last Visit:10/08/2023     Upcoming appointments: 01/09/2024           Natividad Feil, MA  10/30/2023, 14:15

## 2023-11-05 ENCOUNTER — Encounter (INDEPENDENT_AMBULATORY_CARE_PROVIDER_SITE_OTHER): Payer: Self-pay | Admitting: Family Medicine

## 2023-11-06 ENCOUNTER — Other Ambulatory Visit (INDEPENDENT_AMBULATORY_CARE_PROVIDER_SITE_OTHER): Payer: Self-pay | Admitting: Family Medicine

## 2023-11-06 MED ORDER — GLIMEPIRIDE 4 MG TABLET
4.0000 mg | ORAL_TABLET | Freq: Two times a day (BID) | ORAL | 0 refills | Status: DC
Start: 2023-11-06 — End: 2024-02-28

## 2023-11-06 NOTE — Telephone Encounter (Signed)
 Last Visit:10/08/2023     Upcoming appointments: 01/09/2024           Natividad Feil, MA  11/06/2023, 06:58

## 2023-11-18 ENCOUNTER — Encounter (INDEPENDENT_AMBULATORY_CARE_PROVIDER_SITE_OTHER): Payer: Self-pay | Admitting: Family Medicine

## 2023-11-19 ENCOUNTER — Other Ambulatory Visit (INDEPENDENT_AMBULATORY_CARE_PROVIDER_SITE_OTHER): Payer: Self-pay | Admitting: Family Medicine

## 2023-11-19 MED ORDER — OZEMPIC 1 MG/DOSE (4 MG/3 ML) SUBCUTANEOUS PEN INJECTOR
1.0000 mg | PEN_INJECTOR | SUBCUTANEOUS | 1 refills | Status: DC
Start: 2023-11-19 — End: 2024-01-11

## 2023-11-19 NOTE — Telephone Encounter (Signed)
 Last Visit:10/08/2023     Upcoming appointments: 01/09/2024           Natividad Feil, MA  11/19/2023, 07:00

## 2023-12-18 ENCOUNTER — Encounter (INDEPENDENT_AMBULATORY_CARE_PROVIDER_SITE_OTHER): Payer: Self-pay | Admitting: Family Medicine

## 2023-12-18 ENCOUNTER — Other Ambulatory Visit (INDEPENDENT_AMBULATORY_CARE_PROVIDER_SITE_OTHER): Payer: Self-pay | Admitting: Family Medicine

## 2023-12-18 MED ORDER — ATORVASTATIN 10 MG TABLET: 10.0000 mg | ORAL_TABLET | Freq: Every day | ORAL | 0 refills | Status: DC

## 2023-12-18 MED ORDER — DEXCOM G6 SENSOR DEVICE
0 refills | Status: DC
Start: 2023-12-18 — End: 2024-03-31

## 2023-12-18 NOTE — Telephone Encounter (Signed)
 Last Visit:10/08/2023     Upcoming appointments: 01/09/2024           Natividad Feil, MA  12/18/2023, 14:19

## 2023-12-29 LAB — HGA1C (HEMOGLOBIN A1C WITH EST AVG GLUCOSE)
EAG (MG/DL): 128
EAG (MMOL/L): 7.1
HEMOGLOBIN A1C: 6.1

## 2023-12-29 LAB — MICROALBUMIN/CREATININE RATIO, URINE, RANDOM: MICROALBUMIN RANDOM URINE: 45

## 2024-01-01 ENCOUNTER — Encounter (INDEPENDENT_AMBULATORY_CARE_PROVIDER_SITE_OTHER): Payer: Self-pay | Admitting: Family Medicine

## 2024-01-01 DIAGNOSIS — E119 Type 2 diabetes mellitus without complications: Secondary | ICD-10-CM

## 2024-01-08 ENCOUNTER — Other Ambulatory Visit: Payer: Self-pay

## 2024-01-09 ENCOUNTER — Encounter (INDEPENDENT_AMBULATORY_CARE_PROVIDER_SITE_OTHER): Payer: Self-pay | Admitting: Family Medicine

## 2024-01-09 ENCOUNTER — Ambulatory Visit (INDEPENDENT_AMBULATORY_CARE_PROVIDER_SITE_OTHER): Payer: Self-pay | Admitting: Family Medicine

## 2024-01-09 VITALS — BP 126/60 | HR 75 | Temp 97.0°F | Resp 16 | Ht 62.0 in | Wt 253.5 lb

## 2024-01-09 DIAGNOSIS — E782 Mixed hyperlipidemia: Secondary | ICD-10-CM

## 2024-01-09 DIAGNOSIS — E119 Type 2 diabetes mellitus without complications: Secondary | ICD-10-CM

## 2024-01-09 DIAGNOSIS — Z7984 Long term (current) use of oral hypoglycemic drugs: Secondary | ICD-10-CM

## 2024-01-09 DIAGNOSIS — N393 Stress incontinence (female) (male): Secondary | ICD-10-CM

## 2024-01-09 DIAGNOSIS — Z6841 Body Mass Index (BMI) 40.0 and over, adult: Secondary | ICD-10-CM

## 2024-01-09 DIAGNOSIS — I10 Essential (primary) hypertension: Secondary | ICD-10-CM

## 2024-01-09 DIAGNOSIS — E039 Hypothyroidism, unspecified: Secondary | ICD-10-CM

## 2024-01-09 NOTE — Progress Notes (Signed)
 OUTPATIENT PROGRESS NOTE    Subjective:   Patient ID:  Dawn Michael is a pleasant 48 y.o. female.    Chief Complaint: Diabetes, Essential Hypertension, and Hyperlipidemia      History of Present Illness:  DM: Fasting FS range    Nonfasting FS range    Hypoglycemia episodes  No   Compliant with diabetic diet Yes   Sores on feet No    HEMOGLOBIN A1C   Date Value Ref Range Status   04/13/2022 6.2  Final     Results in Last 18 Months   Lab Test 10/07/22  0000 12/29/23  0000   MICALBRNUR 81 45   MICALBCRERAT 5  --          Wt Readings from Last 3 Encounters:   01/09/24 115 kg (253 lb 8 oz)   10/22/23 113 kg (249 lb 6.4 oz)   10/08/23 115 kg (252 lb 12.8 oz)   HTN: HAs No    Dizziness No   Feeling like BP is too high or low No   Compliant with taking meds Yes   Home BP:  did not bring log    BP Readings from Last 3 Encounters:   01/09/24 126/60   10/22/23 (!) 152/88   10/08/23 110/60     HLD: tolerating cholosterol lowering medication     low fat/ cholesterol   Is pt fasting today? No  Lab Results   Component Value Date    CHOLESTEROL 162 06/15/2023    HDLCHOL 46 06/15/2023    LDLCHOL 91 06/15/2023    TRIG 157 06/15/2023      Lab Results   Component Value Date    AST 10 06/15/2023    ALT 12 06/15/2023                 The history is provided by the patient.      Allergies:   Allergies[1]      Medications:     Current Outpatient Medications   Medication Instructions    atorvastatin  (LIPITOR) 10 mg, Oral, Daily    BD NANO 2ND GEN PEN NEEDLE 32 gauge x 5/32 Does not apply Needle For 1 injection day    Blood Sugar Diagnostic (ONETOUCH VERIO TEST STRIPS) Strip 1 Strip, Does not apply, 2 TIMES DAILY    carBAMazepine (TEGRETOL  XR) 100 mg Oral Tablet Sustained Release 12 hr TAKE 1 TABLET (=100MG       TOTAL) 3 TIMES A DAY. TAKE WITH 200MG  TABLET.    DEXCOM G6 SENSOR Does not apply Device As directed    DEXCOM G6 TRANSMITTER Does not apply Device CHANGE EVERY 90 DAYS    DULoxetine (CYMBALTA DR) 60 mg, 2 TIMES DAILY    folic acid  (FOLVITE) 1 mg Oral Tablet     glimepiride  (AMARYL ) 4 mg, Oral, 2 TIMES DAILY    insulin glargine 100 unit/mL Subcutaneous Insulin Pen 32 unit PM    Lancets Misc Accu-chek fast clix lancets, test twice daily    losartan -hydrochlorothiazide  (HYZAAR ) 50-12.5 mg Oral Tablet 1 Tablet, Oral, Daily    metFORMIN  (GLUCOPHAGE  XR) 1,000 mg, Oral, 2 TIMES DAILY    metoclopramide HCl (REGLAN) 10 mg Oral Tablet Take by mouth PRN    ONETOUCH DELICA PLUS LANCET 33 gauge Does not apply Misc No dose, route, or frequency recorded.    Ozempic  1 mg, Subcutaneous, EVERY 7 DAYS    pantoprazole (PROTONIX) 40 mg Oral Tablet, Delayed Release (E.C.) 1 Tablet, Daily  Synthroid  50 mcg, Oral, EVERY MORNING    VRAYLAR 1.5 mg Oral Capsule take 1 capsule by mouth daily         Immunization History:     Immunization History   Administered Date(s) Administered    Covid-19 Vaccine,Pfizer-BioNTech,Purple Top,3yrs+ 09/24/2019, 10/19/2019    Influenza Vaccine, 6 month-adult 03/31/2020, 02/16/2021         Past Medical History:     Past Medical History:   Diagnosis Date    Anxiety     Depression     Diabetes mellitus, type 2     Esophageal reflux     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 She has never used smokeless tobacco. She reports current alcohol use. She reports being sexually active and has had partner(s) who are female. She reports that she does not use drugs.          Review of Systems: Other than  ROS in HPI, all other systems are negative.      Objective:   Vitals:    Vitals:    01/09/24 1036   BP: 126/60   Pulse: 75   Resp: 16   Temp: 36.1 C (97 F)   SpO2: 99%   Weight: 115 kg (253 lb 8 oz)   Height: 1.575 m (5' 2)   BMI: 46.37          Body mass index is 46.37 kg/m.      Constitutional: Alert, well developed, well nourished  HEENT:  Head: NC/AT    Eyes: Sclera anicteric, conjunctiva not injected    Ears: EAC normal, bilateral TMs clear    Nose: No discharge    Throat: MMM, posterior pharynx without erythema or exudate  Neck:   Supple with normal ROM, no cervical LAD, no thyromegaly, no JVD, no carotid bruits  Cardiovascular: RRR, normal S1/S2, no murmurs/rubs/gallops  Pulmonary:  CTAB, equal air entry, nonlabored, no wheezes/crackles/rhonchi  Abdomen:   NABS, NT/ND, soft, no HSM, no masses  Musculoskeletal:  No deformity, no injury, no edema  Neurological:   Alert, oriented x 3, no abnormal tone  Skin:     Warm, pink, dry, no rashes, no jaundice, no pallor, no cyanosis  Psychiatric:  Normal mood, affect, behavior, judgment, and thought content    Results for orders placed or performed in visit on 01/01/24 (from the past 12 weeks)   HGA1C (HEMOGLOBIN A1C WITH EST AVG GLUCOSE)    Collection Time: 12/29/23 12:00 AM   Result Value Ref Range    HEMOGLOBIN A1C 6.1  EAG (MG/DL) 871     EAG (MMOL/L) 7.1    MICROALBUMIN/CREATININE RATIO, URINE, RANDOM    Collection Time: 12/29/23 12:00 AM   Result Value Ref Range    MICROALBUMIN RANDOM URINE 45          Assessment & Plan:   1. Type 2 diabetes mellitus without complication, without long-term current use of insulin (Primary)  Continue current meds, including ACE-I and statin.  Urine microalbumin UTD.  Annual eye exam UTD.  Podiatry not indicated.  Dietary compliance stressed.  Recommend checking FS QD - BID.  Decrease insulin by 2 units    2. Essential hypertension  Well controlled with current regimen which we will continue.        3. Mixed  hyperlipidemia  Continue statin.  Lipid panel at goal within past 6 months.      4. Morbid obesity with BMI of 45.0-49.9, adult (CMS HCC)  Continue exercise  Stop butterfingers    5. Acquired hypothyroidism      6. Stress incontinence  Failed PT        Depression screening is negative. PHQ 2 Total: 0                 Health Maintenance   Topic Date Due    Hepatitis C screening  Never done    HIV Screening  Never done    Adult Tdap-Td (1 - Tdap) Never done    Hepatitis B Vaccine (1 of 3 - 19+ 3-dose series) Never done    Pneumococcal Vaccine, Age 72-49 (1 of 2 - PCV) Never done    Pap smear/HPV  Never done    Colonoscopy  Never done    Influenza Vaccine (1) 01/07/2024    Covid-19 Vaccine (3 - 2025-26 season) 01/07/2024    Diabetic Retinal Exam  01/19/2024    Diabetic Kidney Health eGFR  06/18/2024    Diabetic A1C  06/30/2024    NonMedicare Preventative Exam  07/05/2024    Diabetic Kidney Health Microalb/Cr Ratio  12/28/2024    Depression Screening  01/08/2025    Breast Cancer Screening  11/05/2025       No follow-ups on file.        Madysun Thall, DO               [1]   Allergies  Allergen Reactions    Ceclor [Cefaclor]     Trimox [Amoxicillin]     Wellbutrin [Bupropion Hcl]     Bupropion Itching

## 2024-01-10 ENCOUNTER — Other Ambulatory Visit (INDEPENDENT_AMBULATORY_CARE_PROVIDER_SITE_OTHER): Payer: Self-pay | Admitting: Family Medicine

## 2024-01-14 ENCOUNTER — Encounter (INDEPENDENT_AMBULATORY_CARE_PROVIDER_SITE_OTHER): Payer: Self-pay | Admitting: Family Medicine

## 2024-01-21 ENCOUNTER — Other Ambulatory Visit (INDEPENDENT_AMBULATORY_CARE_PROVIDER_SITE_OTHER): Payer: Self-pay | Admitting: Family Medicine

## 2024-01-21 ENCOUNTER — Encounter (INDEPENDENT_AMBULATORY_CARE_PROVIDER_SITE_OTHER): Payer: Self-pay | Admitting: Family Medicine

## 2024-01-21 MED ORDER — BD NANO 2ND GEN PEN NEEDLE 32 GAUGE X 5/32"
12 refills | Status: AC
Start: 2024-01-21 — End: ?

## 2024-01-21 MED ORDER — DEXCOM G6 TRANSMITTER DEVICE
5 refills | Status: AC
Start: 2024-01-21 — End: ?

## 2024-01-21 NOTE — Telephone Encounter (Signed)
 Last Visit: 01/09/24     Upcoming appointments: 04/10/24      Benedetta Feil, MA  01/21/2024, 14:03

## 2024-02-27 ENCOUNTER — Other Ambulatory Visit (INDEPENDENT_AMBULATORY_CARE_PROVIDER_SITE_OTHER): Payer: Self-pay | Admitting: Family Medicine

## 2024-02-27 NOTE — Telephone Encounter (Signed)
 Last Visit:01/09/2024     Upcoming appointments: 02/29/2024           Dionicia Ricker, MA  02/27/2024, 11:51

## 2024-02-28 NOTE — Telephone Encounter (Signed)
 Last Visit:01/09/2024     Upcoming appointments: 02/29/2024           Benedetta Feil, MA  02/28/2024, 08:06

## 2024-02-29 ENCOUNTER — Other Ambulatory Visit: Payer: Self-pay

## 2024-02-29 ENCOUNTER — Encounter (INDEPENDENT_AMBULATORY_CARE_PROVIDER_SITE_OTHER): Payer: Self-pay | Admitting: Medical

## 2024-02-29 ENCOUNTER — Ambulatory Visit (INDEPENDENT_AMBULATORY_CARE_PROVIDER_SITE_OTHER): Payer: Self-pay | Admitting: Medical

## 2024-02-29 VITALS — BP 130/70 | HR 72 | Temp 96.9°F | Ht 65.0 in | Wt 252.1 lb

## 2024-02-29 DIAGNOSIS — M5432 Sciatica, left side: Secondary | ICD-10-CM

## 2024-02-29 DIAGNOSIS — M25552 Pain in left hip: Secondary | ICD-10-CM

## 2024-02-29 MED ORDER — METHYLPREDNISOLONE 4 MG TABLETS IN A DOSE PACK: ORAL_TABLET | ORAL | 0 refills | Status: DC

## 2024-02-29 NOTE — Progress Notes (Signed)
 [facility]  [address]  [place] [address]        Name:  Nickie Warwick MRN: [id number]   Date:    02/29/2024 Age:  48 y.o.         Chief Complaint: Hip Pain      Subjective:   Dawn Michael is a 48 y.o. female who presents to the office for left sciatic pain.  She has had this in the past and has done physical therapy with improvement.  She has been following with a chiropractor.  She has been experiencing the pain for the past 3 weeks without improvement.    Review of Systems   Constitutional:  Negative for chills, fever and malaise/fatigue.   HENT:  Negative for congestion, ear pain, sinus pain and sore throat.    Respiratory:  Negative for cough and shortness of breath.    Cardiovascular:  Negative for chest pain.   Musculoskeletal:  Positive for back pain (Left sciatic) and joint pain (Left hip). Negative for myalgias.   Neurological:  Negative for weakness and headaches.        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 TAKE WITH 200MG  TABLET.  DEXCOM G6 SENSOR Does not apply Device, As directed  DEXCOM G6 TRANSMITTER Does not apply Device, Check glucose to 4 times day  DULoxetine (CYMBALTA DR) 60 mg Oral Capsule, Delayed Release(E.C.), Take 1 Capsule (60 mg total) by mouth Twice daily  folic acid (FOLVITE) 1 mg Oral Tablet,   glimepiride  (AMARYL ) 4 mg Oral Tablet, TAKE 1 TABLET  TWICE A DAY  insulin glargine 100 unit/mL Subcutaneous Insulin Pen, 32 unit PM  Lancets Misc, Accu-chek fast clix lancets, test twice daily  losartan -hydrochlorothiazide  (HYZAAR ) 50-12.5 mg Oral Tablet, TAKE 1 TABLET DAILY  metFORMIN  (GLUCOPHAGE  XR) 500 mg Oral Tablet Sustained Release 24 hr, TAKE 2 TABLETS TWICE A DAY  metoclopramide HCl (REGLAN) 10 mg Oral Tablet, Take by mouth PRN  ONETOUCH DELICA PLUS LANCET 33 gauge Does not apply Misc,   OZEMPIC  1 mg/dose (4 mg/3 mL) Subcutaneous Pen Injector, INJECT 1MG  SUBCUTANEOUSLY  EVERY 7 DAYS  pantoprazole (PROTONIX) 40 mg Oral Tablet, Delayed Release (E.C.), Take 1 Tablet (40 mg total) by mouth Daily  SYNTHROID  50 mcg Oral Tablet, Take 1 Tablet (50 mcg total) by mouth Every morning  VRAYLAR 1.5 mg Oral Capsule, take 1 capsule by mouth daily    No facility-administered medications prior to visit.      Allergies[1]     Past Surgical History:   Procedure Laterality Date    ANKLE SURGERY      HX CERVICAL POLYPECTOMY  HX CHOLECYSTECTOMY          Objective:     BP 130/70   Pulse 72   Temp 36.1 C (96.9 F)   Ht 1.651 m (5' 5)   Wt 114 kg (252 lb 1.6 oz)   SpO2 100%   BMI 41.95 kg/m          Physical Exam  Constitutional:       General: She is not in acute distress.     Appearance: Normal appearance. She is not ill-appearing, toxic-appearing or diaphoretic.   HENT:      Head: Normocephalic and atraumatic.      Right Ear: External ear normal.      Left Ear: External ear normal.      Nose: Nose normal.   Eyes:      General: No scleral icterus.     Extraocular Movements: Extraocular movements intact.      Conjunctiva/sclera: Conjunctivae normal.      Pupils: Pupils are equal, round, and reactive to light.   Cardiovascular:      Rate and Rhythm: Normal rate and regular rhythm.      Heart sounds: Normal heart sounds. No murmur heard.  Pulmonary:      Effort: Pulmonary effort is normal. No respiratory distress.      Breath sounds: Normal breath sounds. No wheezing,  rhonchi or rales.   Musculoskeletal:         General: Tenderness (Left lumbosacral spine tenderness to palpation) present. Normal range of motion.      Cervical back: Normal range of motion.      Right lower leg: No edema.      Left lower leg: No edema.   Skin:     General: Skin is warm and dry.      Coloration: Skin is not jaundiced or pale.      Findings: No rash.   Neurological:      General: No focal deficit present.      Mental Status: She is alert and oriented to person, place, and time.      Sensory: No sensory deficit.      Motor: No weakness.      Gait: Gait normal.   Psychiatric:         Mood and Affect: Mood normal.         Behavior: Behavior normal.         Thought Content: Thought content normal.         Judgment: Judgment normal.            Assessment/Plan:     1. Left hip pain (Primary)  Will order x-rays, PT, continue with supportive care.  Will send Medrol  Dosepak with a close blood glucose monitoring.  I did review her her recent A1c which was 6.1%, stable.  She believes she has had a steroid in the past.  She will closely monitor her blood sugar and she has Dexcom for this.  With no improvement, may consider MRI of lumbar spine/hip.    2. Sciatic pain, left  See above.    Orders Placed This Encounter    XR LUMBAR SPINE AP/OBLIQUES/LAT/SPOT    XR HIP LEFT W PELVIS 2-3 VIEWS    Refer to Physical Therapy-External    Methylprednisolone  (MEDROL  DOSEPACK) 4 mg Oral Tablets, Dose Pack       Follow Up:   Return for call with no improvement.    Dr. Canada was available for consultation during the time  of this interview and assessment.      Joen Hazy, PA-C         [1]   Allergies  Allergen Reactions    Ceclor [Cefaclor]     Trimox [Amoxicillin]     Wellbutrin [Bupropion Hcl]     Bupropion Itching

## 2024-02-29 NOTE — Nursing Note (Signed)
 Patient is her for left hip pain x 3 weeks. She thinks it is her SI joint. She has been seeing chiropractor and would like an xray and physical therapy.

## 2024-03-03 ENCOUNTER — Other Ambulatory Visit (INDEPENDENT_AMBULATORY_CARE_PROVIDER_SITE_OTHER): Payer: Self-pay

## 2024-03-03 ENCOUNTER — Ambulatory Visit (INDEPENDENT_AMBULATORY_CARE_PROVIDER_SITE_OTHER): Payer: Self-pay | Admitting: Medical

## 2024-03-03 DIAGNOSIS — M5432 Sciatica, left side: Secondary | ICD-10-CM

## 2024-03-03 DIAGNOSIS — M25552 Pain in left hip: Secondary | ICD-10-CM

## 2024-03-04 NOTE — Telephone Encounter (Signed)
 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 If her symptoms persist would consider lower spine MRI     Patient states she is going to have PT at [facility] in [place] and see how she feels after that.

## 2024-03-04 NOTE — Telephone Encounter (Signed)
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  If her symptoms persist would consider lower spine MRI  ----- Message -----  From: Mitchell Planas, [place]  Sent: 03/03/2024   8:21 AM EDT  To: Joen Hazy, PA-C

## 2024-03-18 ENCOUNTER — Encounter (INDEPENDENT_AMBULATORY_CARE_PROVIDER_SITE_OTHER): Payer: Self-pay | Admitting: Medical

## 2024-03-30 ENCOUNTER — Other Ambulatory Visit (INDEPENDENT_AMBULATORY_CARE_PROVIDER_SITE_OTHER): Payer: Self-pay | Admitting: Family Medicine

## 2024-03-31 NOTE — Telephone Encounter (Signed)
 Last Visit:02/29/2024     Upcoming appointments: 04/10/2024           Benedetta Feil, MA  03/31/2024, 07:43

## 2024-04-08 ENCOUNTER — Ambulatory Visit (INDEPENDENT_AMBULATORY_CARE_PROVIDER_SITE_OTHER): Admitting: Family Medicine

## 2024-04-08 ENCOUNTER — Other Ambulatory Visit: Payer: Self-pay

## 2024-04-08 ENCOUNTER — Encounter (INDEPENDENT_AMBULATORY_CARE_PROVIDER_SITE_OTHER): Payer: Self-pay | Admitting: Family Medicine

## 2024-04-08 VITALS — BP 130/68 | HR 79 | Temp 97.6°F | Resp 16 | Ht 65.0 in | Wt 255.2 lb

## 2024-04-08 DIAGNOSIS — I10 Essential (primary) hypertension: Secondary | ICD-10-CM

## 2024-04-08 DIAGNOSIS — J329 Chronic sinusitis, unspecified: Secondary | ICD-10-CM

## 2024-04-08 DIAGNOSIS — E782 Mixed hyperlipidemia: Secondary | ICD-10-CM

## 2024-04-08 DIAGNOSIS — E119 Type 2 diabetes mellitus without complications: Secondary | ICD-10-CM

## 2024-04-08 DIAGNOSIS — H6990 Unspecified Eustachian tube disorder, unspecified ear: Secondary | ICD-10-CM

## 2024-04-08 MED ORDER — INSULIN GLARGINE (U-100) 100 UNIT/ML (3 ML) SUBCUTANEOUS PEN: 32.0000 [IU] | PEN_INJECTOR | Freq: Every evening | SUBCUTANEOUS | 1 refills | Status: AC

## 2024-04-08 MED ORDER — OZEMPIC 1 MG/DOSE (4 MG/3 ML) SUBCUTANEOUS PEN INJECTOR: 1.0000 mg | PEN_INJECTOR | SUBCUTANEOUS | 0 refills | Status: AC

## 2024-04-08 MED ORDER — METFORMIN ER 500 MG TABLET,EXTENDED RELEASE 24 HR: 1000.0000 mg | ORAL_TABLET | Freq: Two times a day (BID) | ORAL | 1 refills | Status: AC

## 2024-04-08 MED ORDER — AZITHROMYCIN 250 MG TABLET: ORAL_TABLET | ORAL | 0 refills | Status: AC

## 2024-04-08 MED ORDER — GLIMEPIRIDE 4 MG TABLET: 4.0000 mg | ORAL_TABLET | Freq: Two times a day (BID) | ORAL | 1 refills | Status: AC

## 2024-04-08 MED ORDER — ATORVASTATIN 10 MG TABLET: 10.0000 mg | ORAL_TABLET | Freq: Every day | ORAL | 1 refills | Status: AC

## 2024-04-08 MED ORDER — DEXCOM G6 SENSOR DEVICE: 3.0000 | 3 refills | Status: AC

## 2024-04-08 MED ORDER — LOSARTAN 50 MG-HYDROCHLOROTHIAZIDE 12.5 MG TABLET: 1.0000 | ORAL_TABLET | Freq: Every day | ORAL | 2 refills | Status: AC

## 2024-04-08 NOTE — Progress Notes (Signed)
 OUTPATIENT PROGRESS NOTE    Subjective:   Patient ID:  Dawn Michael is a pleasant 48 y.o. female.    Chief Complaint: Sinus Infection, Mucous, Headache, Nasal Congestion, Ear Pressure, Diabetes Follow up, and Hypertension      History of Present Illness:  HTN: Feeling like BP is too high or low No   Compliant with taking meds Yes   Home BP:  did not bring log    BP Readings from Last 3 Encounters:   04/08/24 130/68   02/29/24 130/70   01/09/24 126/60   Has a one-week history of increased congestion years feeling full dull headache as well as blowing yellow rhinorrhea bilaterally out of both noses.  No hearing loss she occasionally has some expectorant from her chest when she coughs.  There is no fever chills no visual changes    DM: Fasting FS range 100   Nonfasting FS range    Hypoglycemia episodes  No   Compliant with diabetic diet Yes   Sores on feet No    HEMOGLOBIN A1C   Date Value Ref Range Status   04/13/2022 6.2  Final     Results in Last 18 Months   Lab Test 12/29/23  0000   MICALBRNUR 45         Wt Readings from Last 3 Encounters:   04/08/24 116 kg (255 lb 3.2 oz)   02/29/24 114 kg (252 lb 1.6 oz)   01/09/24 115 kg (253 lb 8 oz)           HLD: tolerating cholosterol lowering medication     low fat/ cholesterol   Is pt fasting today? No  Lab Results   Component Value Date    CHOLESTEROL 162 06/15/2023    HDLCHOL 46 06/15/2023    LDLCHOL 91 06/15/2023    TRIG 157 06/15/2023      Lab Results   Component Value Date    AST 10 06/15/2023    ALT 12 06/15/2023     .  The history is provided by the patient.      Allergies:   Allergies[1]      Medications:     Current Outpatient Medications   Medication Instructions    atorvastatin  (LIPITOR) 10 mg, Oral, Daily    azithromycin  (ZITHROMAX ) 250 mg Oral Tablet Take 500 mg (2 tab) on day 1; take 250 mg (1 tab) on days 2-5.    BD NANO 2ND GEN PEN NEEDLE 32 gauge x 5/32 Does not apply Needle As directed    Blood Sugar Diagnostic (ONETOUCH VERIO TEST STRIPS) Strip 1 Strip,  Does not apply, 2 TIMES DAILY    carBAMazepine (TEGRETOL  XR) 100 mg Oral Tablet Sustained Release 12 hr TAKE 1 TABLET (=100MG       TOTAL) 3 TIMES A DAY. TAKE WITH 200MG  TABLET.    DEXCOM G6 SENSOR Does not apply Device 3 Boxes, apheresis, EVERY 10 DAYS, USE AS DIRECTED    DEXCOM G6 TRANSMITTER Does not apply Device Check glucose to 4 times day    DULoxetine (CYMBALTA DR) 60 mg, 2 TIMES DAILY    folic acid (FOLVITE) 1 mg Oral Tablet     glimepiride  (AMARYL ) 4 mg, Oral, 2 TIMES DAILY    insulin  glargine 100 unit/mL Subcutaneous Insulin  Pen 32 unit PM    insulin  glargine 32 Units, Subcutaneous, NIGHTLY    Lancets Misc Accu-chek fast clix lancets, test twice daily    losartan -hydrochlorothiazide  (HYZAAR ) 50-12.5 mg Oral Tablet 1 Tablet, Oral,  Daily    metFORMIN  (GLUCOPHAGE  XR) 1,000 mg, Oral, 2 TIMES DAILY    metoclopramide HCl (REGLAN) 10 mg Oral Tablet Take by mouth PRN    ONETOUCH DELICA PLUS LANCET 33 gauge Does not apply Misc No dose, route, or frequency recorded.    Ozempic  1 mg, Subcutaneous, EVERY 7 DAYS    pantoprazole (PROTONIX) 40 mg Oral Tablet, Delayed Release (E.C.) 1 Tablet, Daily    Synthroid  50 mcg, Oral, EVERY MORNING    VRAYLAR 1.5 mg Oral Capsule take 1 capsule by mouth daily         Immunization History:     Immunization History   Administered Date(s) Administered    Covid-19 Vaccine,Pfizer-BioNTech,Purple Top,67yrs+ 09/24/2019, 10/19/2019    Influenza Vaccine, 6 month-adult 03/31/2020, 02/16/2021         Past Medical History:     Past Medical History:   Diagnosis Date    Anxiety     Depression     Diabetes mellitus, type 2     Esophageal reflux     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 She has never used smokeless tobacco. She reports current alcohol use. She reports being sexually active and has had partner(s) who are female. She reports that she does not use drugs.          Review of Systems: Other than ROS in HPI, all other systems are negative.      Objective:   Vitals:    Vitals:    04/08/24 1249   BP: 130/68   Pulse: 79   Resp: 16   Temp: 36.4 C (97.6 F)   SpO2: 97%   Weight: 116 kg (255 lb 3.2 oz)   Height: 1.651 m (5' 5)   BMI: 42.47          Body mass index is 42.47 kg/m.      Constitutional: Alert, well developed, well nourished  HEENT:  Head: NC/AT    Eyes: Sclera anicteric, conjunctiva not injected    Ears: EAC normal, bilateral TMs clear    Nose: No discharge    Throat: MMM, posterior pharynx without erythema or exudate  Neck:   Supple with normal ROM, no cervical LAD, no thyromegaly, no JVD, no carotid bruits  Cardiovascular: RRR, normal S1/S2, no murmurs/rubs/gallops  Pulmonary:  CTAB, equal air entry, nonlabored, no wheezes/crackles/rhonchi  Abdomen:   NABS, NT/ND, soft, no HSM, no masses  Musculoskeletal:  No deformity, no injury, no edema  Neurological:   Alert, oriented x 3, no abnormal tone  Skin:  Warm, pink, dry, no rashes, no jaundice, no pallor, no cyanosis  Psychiatric:  Normal mood, affect, behavior, judgment, and thought content    No results found for this or any previous visit (from the past 12 weeks).      Assessment & Plan:     1. Essential hypertension  Well controlled with current regimen which we will continue.        2. Sinusitis, unspecified chronicity, unspecified location (Primary)  zpack    3. Dysfunction of Eustachian tube, unspecified laterality      4. Mixed hyperlipidemia  Continue statin.  Lipid panel at goal  within past 6 months.      5. Type 2 diabetes mellitus without complication, without long-term current use of insulin   Continue current meds, including ACE-I and statin.  Urine microalbumin UTD.  Annual eye exam UTD.  Podiatry not indicated.  Dietary compliance stressed.  Recommend checking FS QD - BID.                      Health Maintenance   Topic Date Due    Hepatitis C screening  Never done    HIV Screening  Never done    Tetanus-Diptheria Vaccines (1 - Tdap) Never done    Hepatitis B Vaccine (1 of 3 - 19+ 3-dose series) Never done    Pneumococcal Vaccine, Age 67-49 (1 of 2 - PCV) Never done    Pap smear/HPV  Never done    Influenza Vaccine (1) 01/07/2024    Covid-19 Vaccine (Shared decision making) (3 - 2025-26 season) 01/07/2024    Diabetic Retinal Exam  04/02/2024    Diabetic A1C  06/30/2024    NonMedicare Preventative Exam  07/05/2024    Diabetic Kidney Health eGFR  10/26/2024    Diabetic Kidney Health Microalb/Cr Ratio  12/28/2024    Depression Screening  01/08/2025    Breast Cancer Screening  11/05/2025    Colonoscopy  09/02/2031       Return in about 3 months (around 07/07/2024).        Dawn Hurtado, DO               [1]   Allergies  Allergen Reactions    Ceclor [Cefaclor]     Trimox [Amoxicillin]     Wellbutrin [Bupropion Hcl]     Bupropion Itching

## 2024-04-10 ENCOUNTER — Encounter (INDEPENDENT_AMBULATORY_CARE_PROVIDER_SITE_OTHER): Payer: Self-pay | Admitting: Family Medicine

## 2024-04-21 ENCOUNTER — Encounter (INDEPENDENT_AMBULATORY_CARE_PROVIDER_SITE_OTHER): Payer: Self-pay | Admitting: Family Medicine

## 2024-04-21 ENCOUNTER — Ambulatory Visit (INDEPENDENT_AMBULATORY_CARE_PROVIDER_SITE_OTHER): Payer: Self-pay | Admitting: Family Medicine

## 2024-04-21 DIAGNOSIS — E119 Type 2 diabetes mellitus without complications: Secondary | ICD-10-CM

## 2024-04-21 LAB — HGA1C (HEMOGLOBIN A1C WITH EST AVG GLUCOSE)
EAG (MG/DL): 146
EAG (MMOL/L): 8.1
HEMOGLOBIN A1C: 6.7

## 2024-05-12 ENCOUNTER — Other Ambulatory Visit (HOSPITAL_COMMUNITY): Payer: Self-pay | Admitting: Internal Medicine

## 2024-05-12 DIAGNOSIS — G4733 Obstructive sleep apnea (adult) (pediatric): Secondary | ICD-10-CM

## 2024-07-10 ENCOUNTER — Encounter (INDEPENDENT_AMBULATORY_CARE_PROVIDER_SITE_OTHER): Payer: Self-pay | Admitting: Family Medicine

## 2024-10-21 ENCOUNTER — Ambulatory Visit (HOSPITAL_COMMUNITY): Payer: Self-pay | Admitting: Internal Medicine
